# Patient Record
Sex: Male | Born: 1945 | Race: White | Hispanic: No | Smoking: Former smoker
Health system: Southern US, Community
[De-identification: ages and names within clinical notes are randomized; demographics above are authoritative.]

## PROBLEM LIST (undated history)

## (undated) DIAGNOSIS — J189 Pneumonia, unspecified organism: Secondary | ICD-10-CM

## (undated) DIAGNOSIS — Z860101 Personal history of adenomatous and serrated colon polyps: Secondary | ICD-10-CM

## (undated) DIAGNOSIS — G250 Essential tremor: Secondary | ICD-10-CM

## (undated) DIAGNOSIS — K449 Diaphragmatic hernia without obstruction or gangrene: Secondary | ICD-10-CM

## (undated) DIAGNOSIS — K439 Ventral hernia without obstruction or gangrene: Secondary | ICD-10-CM

## (undated) DIAGNOSIS — N401 Enlarged prostate with lower urinary tract symptoms: Secondary | ICD-10-CM

## (undated) DIAGNOSIS — I2510 Atherosclerotic heart disease of native coronary artery without angina pectoris: Secondary | ICD-10-CM

## (undated) DIAGNOSIS — L409 Psoriasis, unspecified: Secondary | ICD-10-CM

## (undated) DIAGNOSIS — E7800 Pure hypercholesterolemia, unspecified: Secondary | ICD-10-CM

## (undated) DIAGNOSIS — I10 Essential (primary) hypertension: Secondary | ICD-10-CM

## (undated) DIAGNOSIS — Z85820 Personal history of malignant melanoma of skin: Secondary | ICD-10-CM

## (undated) DIAGNOSIS — K5730 Diverticulosis of large intestine without perforation or abscess without bleeding: Secondary | ICD-10-CM

## (undated) DIAGNOSIS — Z859 Personal history of malignant neoplasm, unspecified: Secondary | ICD-10-CM

## (undated) DIAGNOSIS — Z86010 Personal history of colonic polyps: Secondary | ICD-10-CM

## (undated) DIAGNOSIS — Z9889 Other specified postprocedural states: Secondary | ICD-10-CM

## (undated) DIAGNOSIS — E782 Mixed hyperlipidemia: Secondary | ICD-10-CM

## (undated) DIAGNOSIS — I714 Abdominal aortic aneurysm, without rupture, unspecified: Secondary | ICD-10-CM

## (undated) DIAGNOSIS — R972 Elevated prostate specific antigen [PSA]: Secondary | ICD-10-CM

## (undated) HISTORY — PX: PROSTATE BIOPSY: SHX241

## (undated) HISTORY — PX: MOUTH SURGERY: SHX715

## (undated) HISTORY — PX: INGUINAL HERNIA REPAIR: SUR1180

## (undated) HISTORY — PX: APPENDECTOMY: SHX54

## (undated) HISTORY — PX: OTHER SURGICAL HISTORY: SHX169

## (undated) HISTORY — PX: UMBILICAL HERNIA REPAIR: SHX196

## (undated) HISTORY — DX: Essential (primary) hypertension: I10

## (undated) HISTORY — DX: Essential tremor: G25.0

## (undated) HISTORY — PX: LARYNGOSCOPY: SHX5203

## (undated) HISTORY — PX: COLONOSCOPY W/ POLYPECTOMY: SHX1380

## (undated) HISTORY — PX: ACHILLES TENDON REPAIR: SUR1153

## (undated) HISTORY — DX: Ventral hernia without obstruction or gangrene: K43.9

## (undated) HISTORY — DX: Pure hypercholesterolemia, unspecified: E78.00

---
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
## 1993-05-07 HISTORY — PX: ACHILLES TENDON REPAIR: SUR1153

## 1998-08-31 ENCOUNTER — Ambulatory Visit (HOSPITAL_COMMUNITY): Admission: RE | Admit: 1998-08-31 | Discharge: 1998-08-31 | Payer: Self-pay | Admitting: Gastroenterology

## 1998-08-31 HISTORY — PX: ESOPHAGOGASTRODUODENOSCOPY: SHX5428

## 2002-05-13 ENCOUNTER — Encounter: Payer: Self-pay | Admitting: General Surgery

## 2002-05-15 ENCOUNTER — Observation Stay (HOSPITAL_COMMUNITY): Admission: RE | Admit: 2002-05-15 | Discharge: 2002-05-16 | Payer: Self-pay | Admitting: General Surgery

## 2002-05-15 HISTORY — PX: LAPAROSCOPIC ASSISTED VENTRAL HERNIA REPAIR: SHX6312

## 2002-05-15 HISTORY — PX: VENTRAL HERNIA REPAIR: SHX424

## 2003-09-06 ENCOUNTER — Encounter (INDEPENDENT_AMBULATORY_CARE_PROVIDER_SITE_OTHER): Payer: Self-pay | Admitting: *Deleted

## 2003-09-06 ENCOUNTER — Ambulatory Visit (HOSPITAL_COMMUNITY): Admission: RE | Admit: 2003-09-06 | Discharge: 2003-09-06 | Payer: Self-pay | Admitting: Gastroenterology

## 2004-09-26 ENCOUNTER — Encounter: Payer: Self-pay | Admitting: Cardiology

## 2010-02-08 ENCOUNTER — Ambulatory Visit: Payer: Self-pay | Admitting: Cardiology

## 2010-02-10 ENCOUNTER — Ambulatory Visit: Payer: Self-pay | Admitting: Cardiology

## 2010-09-22 NOTE — Op Note (Signed)
Ruben Sosa, Ruben Sosa                             ACCOUNT NO.:  [id number]   MEDICAL RECORD NO.:  [id number]                   PATIENT TYPE:  AMB   LOCATION:  ENDO                                 FACILITY:  [facility]   PHYSICIAN:  Petra Kuba, M.D.                 DATE OF BIRTH:  November 20, 1945   DATE OF PROCEDURE:  09/06/2003  DATE OF DISCHARGE:                                 OPERATIVE REPORT   PROCEDURE:  Colonoscopy with polypectomy.   ANESTHESIA:  Demerol 75, Versed 8.   INDICATIONS FOR PROCEDURE:  Patient with history of colon polyps, here for  repeat screening.  Consent was signed after risks and benefits, methods,  options were thoroughly discussed in the office in the past.   DESCRIPTION OF PROCEDURE:  Rectal inspection was pertinent for external  hemorrhoids. Small digital exam was negative.  Video colonoscope was  inserted, easily advanced around the colon to the cecum.  This did require  some abdominal pressure but no position changes.  On insertion, left greater  than right diverticula were seen.  Also, a distal ascending small polyp was  seen on insertion and hot biopsied x1.  The cecum was identified by the  appendiceal orifice and the ileocecal valve.  Prep on the right side was  pertinent for some stool adherent to the wall, which could not be washed  off. The rest of the prep was adequate. There was some liquid stool that  required washing and suctioning throughout.  On slow withdrawal through the  colon, cecum and the remainder of the ascending were normal.  The polyp that was hot biopsied on insertion did not look like it had any  residual polypoid tissue. The scope was further withdrawn, other than the  left greater than right diverticula.  No additional findings were seen.  Instrument was slowly withdrawn back through the rectum. Anorectal pull-  through on retroflexion confirmed some small hemorrhoids. Scope was  reinserted towards the left side of the colon. Air was  suctioned, scope was  removed.  The patient tolerated the procedure well. There were no obvious immediate  complications.   ENDOSCOPIC DIAGNOSES:  1. Internal and external hemorrhoids.  2. Left greater than right diverticula.  3. Ascending small polyp, hot biopsied.  4. Otherwise within normal limits to the cecum.   PLAN:  Yearly rectals and guaiacs per Dr. Cato Mulligan.  Asked to see back p.r.n.,  otherwise await pathology and probably recheck on screening in 5 years.                                               Petra Kuba, M.D.   MEM/MEDQ  D:  09/06/2003  T:  09/06/2003  Job:  [id number]   cc:  Colleen Can. Deborah Chalk, M.D.  Fax: [phone]   Valetta Mole. Swords, M.D. [facility]

## 2010-09-22 NOTE — Op Note (Signed)
Ruben Sosa, Ruben Sosa                             ACCOUNT NO.:  [id number]   MEDICAL RECORD NO.:  [id number]                   PATIENT TYPE:  OBV   LOCATION:  0470                                 FACILITY:  [facility]   PHYSICIAN:  Angelia Mould. Derrell Lolling, M.D.             DATE OF BIRTH:  Sep 27, 1945   DATE OF PROCEDURE:  05/15/2002  DATE OF DISCHARGE:                                 OPERATIVE REPORT   PREOPERATIVE DIAGNOSIS:  Ventral hernia.   POSTOPERATIVE DIAGNOSIS:  Ventral hernia.   OPERATION PERFORMED:  Laparoscopic repair of ventral hernia with 15 x 15 cm  dual Gore-Tex mesh.   SURGEON:  Angelia Mould. Derrell Lolling, M.D.   OPERATIVE INDICATIONS:  This is a 65 year old white man, who had an  umbilical hernia repair 25 years ago.  He has had a recurrence for several  years that has been getting bigger, and he wants to have this repaired.  On  exam, he has an infraumbilical scar, and there is a moderately large  recurrent hernia above the umbilicus, perhaps 4 cm in diameter by physical  exam.  He is brought to the operating room electively.   OPERATIVE TECHNIQUE:  Following the induction of general endotracheal  anesthesia, a Foley catheter was inserted, and the abdomen was prepped and  draped in a sterile fashion.  Intravenous Kefzol was given.  Marcaine 0.5%  with epinephrine was used as a local infiltration anesthetic.  A 2 cm  transverse incision was made in the left side of the abdomen about the  anterior axillary line level.  Dissection was carried down through the  muscle layers in a muscle-splitting fashion.  The peritoneum was elevated  and the abdominal cavity entered under direct vision.  There were no  adhesions in this area, and this was an atraumatic entry.  A 10 mm Hasson  trocar was inserted and secured with a pursestring suture of 0 Vicryl.  Pneumoperitoneum was created.  Video camera was inserted.  We found that he  had about a 5 x 6 cm ventral hernia.  There were multiple  channels to this.  There was some omentum that was partially incarcerated in this.  I placed  two 5 mm trocars on the left side of the abdomen and two 5 mm trocars in the  right side of the abdomen.  The omentum was taken down with scissor cautery.  This was fairly straightforward with good visualization.  There was no  bleeding.  I marked the edges of the hernia defect with a spinal needle.  I  then measured 3-4 cm peripheral to that in all directions and marked what  turned out to be almost a perfect circle, 15 cm x 15 cm in diameter.  I  marked the skin in six equidistant points for fixation sutures.  A large  piece of dual Gore-Tex mesh was brought to the operative field and using  marking on the abdominal wall, was cut into an appropriate shape, and a  template was created using the marking pen, marking in six equidistant  points around the rim of this.  At these points, we placed fixation sutures  of #1 Novofil with the knots tied on the rough side of the mesh.  The smooth  side of the mesh was marked for reference.   We then rolled the mesh up and passed it through the Hasson trocar in the  left flank and into the abdominal cavity.  Under direct vision, we then  unrolled the mesh and positioned it.  At each of the six equidistant points  and slightly to peripheral to the edge of the Ruben Sosa, we made a small incision  and then passed the Endoclose device through the abdominal wall and through  the sutures up through this.  We made sure that we got at least a 1 cm bite  of fascia for security.  After all six fixation sutures were passed, we  elevated the mesh up to the abdominal wall, and it covered the defect quite  nicely without any redundancy but without any undue tension.  We then tied  all of the six fixation sutures and then elevated the skin to bury the  knots.  We used a 5 mm screw type tacking device to tack the mesh all the  way around the periphery.  We placed a tack at the  edge of the mesh  circumferentially, each tack being about 1 cm apart.  We then placed a few  tacks more centrally to affix the mesh to the anterior abdominal wall, but  great care was taken not to place any tack in the hernia defect where it  might injure the skin.  Photographs were taken.  We inspected the repair and  the omentum and the rest of the abdominal cavity.  There were no anatomic  abnormalities, and there was no bleeding.  The trocars were removed under  direct vision, and there was no bleeding from the trocar sites.  The  pneumoperitoneum was released.  The fascia at the left flank trocar site was  closed with 0 Vicryl sutures.  Skin incision in the left flank was closed  with skin staples.  The rest of the incisions were closed with Steri-Strips.  Clean bandages were placed and the patient taken to the recovery room in  stable condition.  Estimated blood loss was about 10-20 cc.  Complications  none.  Sponge, needle, and instrument counts were correct.                                               Angelia Mould. Derrell Lolling, M.D.    HMI/MEDQ  D:  05/15/2002  T:  05/15/2002  Job:  [id number]   cc:   Valetta Mole. Swords, M.D. [facility]  [address] [place]  [place] [address]  Fax: 1   Colleen Can. Deborah Chalk, M.D.  [address]  [place]  [place] [address]  Fax: [phone]

## 2011-05-23 ENCOUNTER — Telehealth: Payer: Self-pay | Admitting: *Deleted

## 2011-05-23 ENCOUNTER — Other Ambulatory Visit: Payer: Self-pay | Admitting: Nurse Practitioner

## 2011-05-23 ENCOUNTER — Other Ambulatory Visit: Payer: Self-pay | Admitting: *Deleted

## 2011-05-23 MED ORDER — BISOPROLOL-HYDROCHLOROTHIAZIDE 5-6.25 MG PO TABS: 1.0000 | ORAL_TABLET | Freq: Every day | ORAL | Status: DC

## 2011-05-23 MED ORDER — SIMVASTATIN 80 MG PO TABS: 80.0000 mg | ORAL_TABLET | Freq: Every day | ORAL | Status: DC

## 2011-05-23 MED ORDER — FENOFIBRATE MICRONIZED 134 MG PO CAPS: 134.0000 mg | ORAL_CAPSULE | Freq: Every day | ORAL | Status: DC

## 2011-05-23 NOTE — Telephone Encounter (Signed)
SPOKE WITH PT NEEDS REFILLS ON MEDS AND NEEDS  APPT WITH CARDIOLOGY FORMER TENNANT PT. DR ROSS TO SEE TRANSFERRED TO SCHEDULERS  TO SET UP APPT . REQUESTED MEDS   FILLED PER PT'S REQUEST /CY

## 2011-06-04 ENCOUNTER — Other Ambulatory Visit: Payer: Self-pay | Admitting: Dermatology

## 2011-06-11 ENCOUNTER — Ambulatory Visit (INDEPENDENT_AMBULATORY_CARE_PROVIDER_SITE_OTHER): Payer: BC Managed Care – PPO | Admitting: Internal Medicine

## 2011-06-11 ENCOUNTER — Encounter: Payer: Self-pay | Admitting: Internal Medicine

## 2011-06-11 DIAGNOSIS — N529 Male erectile dysfunction, unspecified: Secondary | ICD-10-CM

## 2011-06-11 DIAGNOSIS — I10 Essential (primary) hypertension: Secondary | ICD-10-CM

## 2011-06-11 DIAGNOSIS — E782 Mixed hyperlipidemia: Secondary | ICD-10-CM

## 2011-06-11 DIAGNOSIS — E785 Hyperlipidemia, unspecified: Secondary | ICD-10-CM

## 2011-06-11 NOTE — Progress Notes (Addendum)
HPI Patient is a 66 year old with a history of HTN, dyslipidemia, familial tremor.  He was previously followed by S. Deborah Chalk  Last seen October 2011. Since seen he has done well.  He is retired and is exercising more and watching what he eats.  He has lost signif wt. He denies CP . No SOB  No palpitations.  No Known Allergies  Current Outpatient Prescriptions  Medication Sig Dispense Refill  . Ascorbic Acid (VITAMIN C) 500 MG CAPS Take 500 mg by mouth 2 (two) times daily.      . ASPIRIN EC PO Take 81 mg by mouth daily.      . bisoprolol-hydrochlorothiazide (ZIAC) 5-6.25 MG per tablet Take 1 tablet by mouth daily.  90 tablet  3  . fenofibrate micronized (LOFIBRA) 134 MG capsule Take 1 capsule (134 mg total) by mouth daily before breakfast.  90 capsule  3  . fish oil-omega-3 fatty acids 1000 MG capsule Take 1 g by mouth 2 (two) times daily.      . Multiple Vitamin (MULTIVITAMIN) capsule Take 1 capsule by mouth daily.      . nebivolol (BYSTOLIC) 5 MG tablet Take 5 mg by mouth daily.      . NON FORMULARY Take 500 mg by mouth 2 (two) times daily. l-laine 500 mg      . simvastatin (ZOCOR) 80 MG tablet Take 1 tablet (80 mg total) by mouth at bedtime.  90 tablet  11    Past Medical History  Diagnosis Date  . Hypertension   . Ventral hernia   . Hypercholesteremia   . Familial tremor     Past Surgical History  Procedure Date  . Ventral hernia repair 05/15/2002  . Colonoscopy w/ polypectomy   . Esophagogastroduodenoscopy 08/31/1998    with biopsies  . Laryngoscopy     flexible fiberoptic  . Status post laparoscopic repair of a ventral incisional hernia 1/9/  . Appendectomy   . Vastectomy   . Achilles tendon repair   . Mouth surgery     Family History  Problem Relation Age of Onset  . Coronary artery disease Father     History   Social History  . Marital Status: Married    Spouse Name: N/A    Number of Children: N/A  . Years of Education: N/A   Occupational History  . Not on  file.   Social History Main Topics  . Smoking status: Not on file  . Smokeless tobacco: Not on file  . Alcohol Use:   . Drug Use:   . Sexually Active:    Other Topics Concern  . Not on file   Social History Narrative  . No narrative on file    Review of Systems:  All systems reviewed.  They are negative to the above problem except as previously stated.  Vital Signs: There were no vitals taken for this visit.  Physical Exam  HEENT:  Normocephalic, atraumatic. EOMI, PERRLA.  Neck: JVP is normal. No thyromegaly. No bruits.  Lungs: clear to auscultation. No rales no wheezes.  Heart: Regular rate and rhythm. Normal S1, S2. No S3.   No significant murmurs. PMI not displaced.  Abdomen:  Supple, nontender. Normal bowel sounds. No masses. No hepatomegaly.  Extremities:   Good distal pulses throughout. No lower extremity edema.  Musculoskeletal :moving all extremities.  Neuro:   alert and oriented x3.  CN II-XII grossly intact.  EKG:  Sinus bradycardia  54 bpm.   Assessment and Plan:

## 2011-06-12 ENCOUNTER — Ambulatory Visit: Payer: BC Managed Care – PPO

## 2011-06-12 ENCOUNTER — Encounter: Payer: Self-pay | Admitting: Internal Medicine

## 2011-06-12 DIAGNOSIS — I10 Essential (primary) hypertension: Secondary | ICD-10-CM

## 2011-06-12 DIAGNOSIS — E782 Mixed hyperlipidemia: Secondary | ICD-10-CM

## 2011-06-12 LAB — BASIC METABOLIC PANEL
CO2: 26 mEq/L (ref 19–32)
Calcium: 9.6 mg/dL (ref 8.4–10.5)
Chloride: 106 mEq/L (ref 96–112)
Creatinine, Ser: 0.7 mg/dL (ref 0.4–1.5)
Sodium: 140 mEq/L (ref 135–145)

## 2011-06-12 LAB — AST: AST: 25 U/L (ref 0–37)

## 2011-06-12 LAB — CBC WITH DIFFERENTIAL/PLATELET
Basophils Relative: 0.9 % (ref 0.0–3.0)
Eosinophils Absolute: 0.3 10*3/uL (ref 0.0–0.7)
Eosinophils Relative: 5.3 % — ABNORMAL HIGH (ref 0.0–5.0)
Hemoglobin: 13.6 g/dL (ref 13.0–17.0)
Lymphocytes Relative: 32 % (ref 12.0–46.0)
MCHC: 34 g/dL (ref 30.0–36.0)
MCV: 90.8 fl (ref 78.0–100.0)
Neutro Abs: 2.8 10*3/uL (ref 1.4–7.7)
Neutrophils Relative %: 54.2 % (ref 43.0–77.0)
RBC: 4.39 Mil/uL (ref 4.22–5.81)
WBC: 5.1 10*3/uL (ref 4.5–10.5)

## 2011-06-12 LAB — PSA: PSA: 1.05 ng/mL (ref 0.10–4.00)

## 2011-06-13 DIAGNOSIS — E785 Hyperlipidemia, unspecified: Secondary | ICD-10-CM | POA: Insufficient documentation

## 2011-06-13 DIAGNOSIS — I10 Essential (primary) hypertension: Secondary | ICD-10-CM | POA: Insufficient documentation

## 2011-06-13 DIAGNOSIS — N529 Male erectile dysfunction, unspecified: Secondary | ICD-10-CM | POA: Insufficient documentation

## 2011-06-13 NOTE — Assessment & Plan Note (Signed)
Discussed.  B blocker prob having some effect on this.  But helps his tremor as well as BP.   If he would like further evaluation I would recomm referral to urology.

## 2011-06-13 NOTE — Assessment & Plan Note (Signed)
Tolerating current dose of simvistatin.  Will check lipomed and compare to previous.

## 2011-06-13 NOTE — Assessment & Plan Note (Signed)
BP is good.  He would like to decrease meds if able.  He may notice some more improvement if he loses wt.  He can cut bisoprolol/HCTZ  In 1/2 and follow BP.   I encouraged him to stay active.

## 2011-06-14 LAB — NMR LIPOPROFILE WITH LIPIDS
HDL Particle Number: 36.1 umol/L (ref >=30.5)
LDL Size: 21 nm (ref >20.5)
Small LDL Particle Number: 533 nmol/L — ABNORMAL HIGH (ref <=527)
Triglycerides: 87 mg/dL (ref <150)

## 2011-08-16 ENCOUNTER — Other Ambulatory Visit: Payer: Self-pay | Admitting: Internal Medicine

## 2011-11-14 ENCOUNTER — Ambulatory Visit (INDEPENDENT_AMBULATORY_CARE_PROVIDER_SITE_OTHER): Payer: BC Managed Care – PPO | Admitting: *Deleted

## 2011-11-14 DIAGNOSIS — E785 Hyperlipidemia, unspecified: Secondary | ICD-10-CM

## 2011-11-14 DIAGNOSIS — R0989 Other specified symptoms and signs involving the circulatory and respiratory systems: Secondary | ICD-10-CM

## 2011-11-16 LAB — NMR LIPOPROFILE WITH LIPIDS
Cholesterol, Total: 196 mg/dL
HDL Particle Number: 40.1 umol/L
HDL-C: 56 mg/dL
LDL (calc): 127 mg/dL — ABNORMAL HIGH
LDL Particle Number: 1629 nmol/L — ABNORMAL HIGH
LDL Size: 21 nm
LP-IR Score: 43
Small LDL Particle Number: 888 nmol/L — ABNORMAL HIGH
Triglycerides: 63 mg/dL

## 2011-11-21 ENCOUNTER — Telehealth: Payer: Self-pay | Admitting: Internal Medicine

## 2011-11-21 NOTE — Telephone Encounter (Signed)
Reviewed chart and do not see where Dr Tenny Craw reviewed the labs. Will forward to Dr. Tenny Craw for review.  Patient aware not in the office until 7/29

## 2011-11-21 NOTE — Telephone Encounter (Signed)
New msg Pt had blood work last week and wants to know results

## 2011-11-22 NOTE — Telephone Encounter (Signed)
  Had been on 80 ZOcor and fenofibrate 134. Cut back to 40 Zocor and continue  fenofibrate Patient has had steroid injections recently Consider Vytorin 10/40--even alone. Review with pharmacy.

## 2011-11-23 NOTE — Telephone Encounter (Signed)
Vytorin 10/40 may be better option than fenfobrate and Zocor.  Addition of Zetia may help decrease particle numbers better than fenofibrate and pt's TG are well controlled.

## 2011-12-13 ENCOUNTER — Other Ambulatory Visit: Payer: Self-pay | Admitting: *Deleted

## 2011-12-13 DIAGNOSIS — E782 Mixed hyperlipidemia: Secondary | ICD-10-CM

## 2012-02-12 ENCOUNTER — Other Ambulatory Visit: Payer: BC Managed Care – PPO

## 2012-05-20 ENCOUNTER — Telehealth: Payer: Self-pay | Admitting: Internal Medicine

## 2012-05-20 DIAGNOSIS — E785 Hyperlipidemia, unspecified: Secondary | ICD-10-CM

## 2012-05-20 DIAGNOSIS — I10 Essential (primary) hypertension: Secondary | ICD-10-CM

## 2012-05-20 NOTE — Telephone Encounter (Signed)
Pt states he always gets a PSA done yearly.  He has been taking a lot of aleve for back issues and he is requesting that Dr Tenny Craw order a ua to check for protein.  He would like to get these labs done at the [facility] office along with his lipid and liver as soon as possible.  Is it ok to order the ua and the psa?

## 2012-05-20 NOTE — Telephone Encounter (Signed)
N/A.  LMTC. 

## 2012-05-20 NOTE — Telephone Encounter (Signed)
New problem :   Need lab work put into the system    Need dip stick test, PSA .

## 2012-05-20 NOTE — Telephone Encounter (Signed)
Follow-up:    Patient returned your call.  Please call back. 

## 2012-05-21 NOTE — Telephone Encounter (Signed)
Set up for lipomed panel, AST, BMET, TSH, PSA, CBC and UA at [facility].

## 2012-05-21 NOTE — Telephone Encounter (Signed)
Labs were ordered and pt was notified.

## 2012-05-21 NOTE — Telephone Encounter (Signed)
Recomm along with lipids to get CBC, BMET, TSH, PSA  And AST checked  Also check UA Set up for [facility] office.

## 2012-05-21 NOTE — Telephone Encounter (Signed)
He states he will get the labs done tomorrow.

## 2012-05-22 ENCOUNTER — Other Ambulatory Visit: Payer: Self-pay | Admitting: Internal Medicine

## 2012-05-23 ENCOUNTER — Other Ambulatory Visit (INDEPENDENT_AMBULATORY_CARE_PROVIDER_SITE_OTHER): Payer: BC Managed Care – PPO

## 2012-05-23 DIAGNOSIS — E785 Hyperlipidemia, unspecified: Secondary | ICD-10-CM

## 2012-05-23 DIAGNOSIS — I10 Essential (primary) hypertension: Secondary | ICD-10-CM

## 2012-05-23 LAB — BASIC METABOLIC PANEL
BUN: 16 mg/dL (ref 6–23)
CO2: 28 mEq/L (ref 19–32)
Calcium: 9.5 mg/dL (ref 8.4–10.5)
Chloride: 108 mEq/L (ref 96–112)
Creatinine, Ser: 0.7 mg/dL (ref 0.4–1.5)

## 2012-05-23 LAB — CBC WITH DIFFERENTIAL/PLATELET
Basophils Absolute: 0.1 10*3/uL (ref 0.0–0.1)
HCT: 41.5 % (ref 39.0–52.0)
Hemoglobin: 14.3 g/dL (ref 13.0–17.0)
Lymphs Abs: 1.8 10*3/uL (ref 0.7–4.0)
MCHC: 34.3 g/dL (ref 30.0–36.0)
MCV: 89.3 fl (ref 78.0–100.0)
Monocytes Absolute: 0.5 10*3/uL (ref 0.1–1.0)
Monocytes Relative: 7.9 % (ref 3.0–12.0)
Neutro Abs: 3.1 10*3/uL (ref 1.4–7.7)
RDW: 13.7 % (ref 11.5–14.6)

## 2012-05-23 LAB — URINALYSIS
Specific Gravity, Urine: 1.025 (ref 1.000–1.030)
Total Protein, Urine: NEGATIVE
Urine Glucose: NEGATIVE
Urobilinogen, UA: 0.2 (ref 0.0–1.0)
pH: 6 (ref 5.0–8.0)

## 2012-05-23 LAB — TSH: TSH: 1.62 u[IU]/mL (ref 0.35–5.50)

## 2012-05-27 LAB — NMR LIPOPROFILE WITH LIPIDS
Cholesterol, Total: 147 mg/dL (ref <200)
HDL Particle Number: 35.3 umol/L (ref >=30.5)
LDL Size: 20.5 nm — ABNORMAL LOW (ref >20.5)
LP-IR Score: 57 — ABNORMAL HIGH (ref <=45)
Large HDL-P: 3.6 umol/L — ABNORMAL LOW (ref >=4.8)
Large VLDL-P: 1.6 nmol/L (ref <=2.7)
Small LDL Particle Number: 460 nmol/L (ref <=527)

## 2012-08-27 ENCOUNTER — Other Ambulatory Visit: Payer: Self-pay | Admitting: *Deleted

## 2012-08-27 MED ORDER — BISOPROLOL-HYDROCHLOROTHIAZIDE 5-6.25 MG PO TABS: ORAL_TABLET | ORAL | Status: DC

## 2012-09-30 ENCOUNTER — Other Ambulatory Visit: Payer: Self-pay | Admitting: Dermatology

## 2012-11-25 ENCOUNTER — Other Ambulatory Visit: Payer: Self-pay | Admitting: Internal Medicine

## 2013-02-23 ENCOUNTER — Other Ambulatory Visit: Payer: Self-pay | Admitting: Internal Medicine

## 2013-05-05 ENCOUNTER — Telehealth: Payer: Self-pay | Admitting: Internal Medicine

## 2013-05-05 DIAGNOSIS — I10 Essential (primary) hypertension: Secondary | ICD-10-CM

## 2013-05-05 DIAGNOSIS — N529 Male erectile dysfunction, unspecified: Secondary | ICD-10-CM

## 2013-05-05 DIAGNOSIS — E785 Hyperlipidemia, unspecified: Secondary | ICD-10-CM

## 2013-05-05 NOTE — Telephone Encounter (Signed)
New message  Pt would like to know if dr Tenny Craw wants him to do his routine blood work

## 2013-05-05 NOTE — Telephone Encounter (Signed)
The pt was last seen by Dr Tenny Craw 06/11/11 and has a pending appointment with Dr Tenny Craw on 06/12/13.  I will forward this message to Dr Tenny Craw to review the pt's chart and give orders for what labs need to be checked on this pt prior to appointment.

## 2013-05-08 NOTE — Telephone Encounter (Signed)
Please set patient up for :  CBC, BMET, LipoMed panel, TSH, PSA and liver panel before clinic visit.

## 2013-05-11 NOTE — Telephone Encounter (Signed)
Spoke with pt, Aware of dr Tenny Craw recommendation orders placed

## 2013-05-27 ENCOUNTER — Other Ambulatory Visit: Payer: Self-pay | Admitting: Internal Medicine

## 2013-05-29 ENCOUNTER — Other Ambulatory Visit (INDEPENDENT_AMBULATORY_CARE_PROVIDER_SITE_OTHER): Payer: Managed Care, Other (non HMO)

## 2013-05-29 DIAGNOSIS — E785 Hyperlipidemia, unspecified: Secondary | ICD-10-CM

## 2013-05-29 DIAGNOSIS — N529 Male erectile dysfunction, unspecified: Secondary | ICD-10-CM

## 2013-05-29 DIAGNOSIS — I10 Essential (primary) hypertension: Secondary | ICD-10-CM

## 2013-05-29 LAB — BASIC METABOLIC PANEL
BUN: 14 mg/dL (ref 6–23)
CO2: 25 mEq/L (ref 19–32)
CREATININE: 0.6 mg/dL (ref 0.4–1.5)
Calcium: 9.4 mg/dL (ref 8.4–10.5)
Chloride: 106 mEq/L (ref 96–112)
GFR: 133.12 mL/min (ref >60.00)
Glucose, Bld: 101 mg/dL — ABNORMAL HIGH (ref 70–99)
POTASSIUM: 4.1 meq/L (ref 3.5–5.1)
Sodium: 138 mEq/L (ref 135–145)

## 2013-05-29 LAB — CBC WITH DIFFERENTIAL/PLATELET
BASOS ABS: 0 10*3/uL (ref 0.0–0.1)
Basophils Relative: 0.6 % (ref 0.0–3.0)
EOS ABS: 0.3 10*3/uL (ref 0.0–0.7)
Eosinophils Relative: 5.8 % — ABNORMAL HIGH (ref 0.0–5.0)
HCT: 41.1 % (ref 39.0–52.0)
Hemoglobin: 14 g/dL (ref 13.0–17.0)
Lymphocytes Relative: 31.4 % (ref 12.0–46.0)
Lymphs Abs: 1.6 10*3/uL (ref 0.7–4.0)
MCHC: 34.2 g/dL (ref 30.0–36.0)
MCV: 91.9 fl (ref 78.0–100.0)
Monocytes Absolute: 0.4 10*3/uL (ref 0.1–1.0)
Monocytes Relative: 7.5 % (ref 3.0–12.0)
NEUTROS ABS: 2.8 10*3/uL (ref 1.4–7.7)
NEUTROS PCT: 54.7 % (ref 43.0–77.0)
Platelets: 193 10*3/uL (ref 150.0–400.0)
RBC: 4.47 Mil/uL (ref 4.22–5.81)
RDW: 13.6 % (ref 11.5–14.6)
WBC: 5.2 10*3/uL (ref 4.5–10.5)

## 2013-05-29 LAB — HEPATIC FUNCTION PANEL
ALT: 32 U/L (ref 0–53)
AST: 25 U/L (ref 0–37)
Albumin: 4 g/dL (ref 3.5–5.2)
Alkaline Phosphatase: 60 U/L (ref 39–117)
Bilirubin, Direct: 0.1 mg/dL (ref 0.0–0.3)
TOTAL PROTEIN: 6.6 g/dL (ref 6.0–8.3)
Total Bilirubin: 1.1 mg/dL (ref 0.3–1.2)

## 2013-05-29 LAB — TSH: TSH: 1.78 u[IU]/mL (ref 0.35–5.50)

## 2013-05-29 LAB — PSA: PSA: 1.41 ng/mL (ref 0.10–4.00)

## 2013-06-01 LAB — NMR LIPOPROFILE WITH LIPIDS
CHOLESTEROL, TOTAL: 228 mg/dL — AB (ref <200)
HDL Particle Number: 32 umol/L (ref >=30.5)
HDL SIZE: 8.5 nm — AB (ref >=9.2)
HDL-C: 44 mg/dL (ref >=40)
LDL CALC: 140 mg/dL — AB (ref <100)
LDL PARTICLE NUMBER: 2067 nmol/L — AB (ref <1000)
LDL Size: 20.4 nm — ABNORMAL LOW (ref >20.5)
LP-IR Score: 73 — ABNORMAL HIGH (ref <=45)
Large HDL-P: 2.1 umol/L — ABNORMAL LOW (ref >=4.8)
Large VLDL-P: 3.9 nmol/L — ABNORMAL HIGH (ref <=2.7)
SMALL LDL PARTICLE NUMBER: 1290 nmol/L — AB (ref <=527)
Triglycerides: 220 mg/dL — ABNORMAL HIGH (ref <150)
VLDL SIZE: 44.5 nm (ref <=46.6)

## 2013-06-10 DIAGNOSIS — Z8669 Personal history of other diseases of the nervous system and sense organs: Secondary | ICD-10-CM | POA: Insufficient documentation

## 2013-06-12 ENCOUNTER — Encounter: Payer: Self-pay | Admitting: Internal Medicine

## 2013-06-12 ENCOUNTER — Ambulatory Visit (INDEPENDENT_AMBULATORY_CARE_PROVIDER_SITE_OTHER): Payer: Managed Care, Other (non HMO) | Admitting: Internal Medicine

## 2013-06-12 ENCOUNTER — Encounter (INDEPENDENT_AMBULATORY_CARE_PROVIDER_SITE_OTHER): Payer: Self-pay

## 2013-06-12 VITALS — BP 128/74 | HR 53 | Ht 74.0 in | Wt 212.0 lb

## 2013-06-12 DIAGNOSIS — I10 Essential (primary) hypertension: Secondary | ICD-10-CM

## 2013-06-12 NOTE — Progress Notes (Signed)
HPI Patient is a 68 year old with a history of HTN, dyslipidemia, familial tremor. He was previously followed by S. Deborah Chalk  I saw him in clinic in 2013 Since seen he has done well.  He is no longer having problems with back pain which hindered his activity in the past He lost wt then gained it back over the holidays  Is goal is to lose 20 more pounds. He does have some cramping in his legs  Both thighs and calves/feet.  Will move around  Take a K supplement from wife.  Drinks gatorade. Plays golf regularly. Denies CP  Breathing is good Has stopped all cholesterol meds.     No Known Allergies  Current Outpatient Prescriptions  Medication Sig Dispense Refill  . Ascorbic Acid (VITAMIN C) 500 MG CAPS Take 500 mg by mouth 2 (two) times daily.      . ASPIRIN EC PO Take 81 mg by mouth daily.      . bisoprolol-hydrochlorothiazide (ZIAC) 5-6.25 MG per tablet TAKE 1 TABLET BY MOUTH EVERY DAY  90 tablet  0  . fish oil-omega-3 fatty acids 1000 MG capsule Take 1 g by mouth 2 (two) times daily.      Marland Kitchen Lysine 500 MG CAPS Take by mouth daily.      . Multiple Vitamin (MULTIVITAMIN) capsule Take 1 capsule by mouth daily.      . sildenafil (REVATIO) 20 MG tablet as directed.       No current facility-administered medications for this visit.    Past Medical History  Diagnosis Date  . Hypertension   . Ventral hernia   . Hypercholesteremia   . Familial tremor     Past Surgical History  Procedure Laterality Date  . Ventral hernia repair  05/15/2002  . Colonoscopy w/ polypectomy    . Esophagogastroduodenoscopy  08/31/1998    with biopsies  . Laryngoscopy      flexible fiberoptic  . Status post laparoscopic repair of a ventral incisional hernia  1/9/  . Appendectomy    . Vastectomy    . Achilles tendon repair    . Mouth surgery      Family History  Problem Relation Age of Onset  . Coronary artery disease Father     History   Social History  . Marital Status: Married    Spouse Name: N/A     Number of Children: N/A  . Years of Education: N/A   Occupational History  . Not on file.   Social History Main Topics  . Smoking status: Current Some Day Smoker    Types: Cigars  . Smokeless tobacco: Not on file  . Alcohol Use: Not on file  . Drug Use: Not on file  . Sexual Activity: Not on file   Other Topics Concern  . Not on file   Social History Narrative  . No narrative on file    Review of Systems:  All systems reviewed.  They are negative to the above problem except as previously stated.  Vital Signs: Ht 6\' 2"  (1.88 m)  Wt 212 lb (96.163 kg)  BMI 27.21 kg/m2 BP 128/74  P 53  Physical Exam Patinet is in NAD HEENT:  Normocephalic, atraumatic. EOMI, PERRLA.  Neck: JVP is normal.  No bruits.  Lungs: clear to auscultation. No rales no wheezes.  Heart: Regular rate and rhythm. Normal S1, S2. No S3.   No significant murmurs. PMI not displaced.  Abdomen:  Supple, nontender. Normal bowel sounds. No masses. No  hepatomegaly.  Extremities:   Good distal pulses throughout. No lower extremity edema.  Musculoskeletal :moving all extremities.  Neuro:   alert and oriented x3.  CN II-XII grossly intact.  EKG  Sinus bradycardia  53 bpm.    Assessment and Plan:  1.  HTN  BP is good.  He notes rare dizziness.  With cramping in legs he suggested cutting Bystolic in 1/2  He can try this but he will need to keep close f/u on BP  If in upper 140s or 150s  or if diastolic upper 80s/90s he should go back to a whole tablet  2.  Hyperlipidemia.  There may have been a miscommunication when he stopped his meds.  He was on Tricor and Zocor.  After review with pharmacy I was considering Vytorin. He is off meds for now.  Admits to recent dietary indiscretions over holidays  Plans to lose wt Would recomm trial fo diet and wt loss  He will call when ready to get fasting labs again.    Otherwise f/u in 1 year

## 2013-07-22 ENCOUNTER — Other Ambulatory Visit: Payer: Self-pay | Admitting: Dermatology

## 2013-08-26 ENCOUNTER — Other Ambulatory Visit: Payer: Self-pay

## 2013-08-26 MED ORDER — BISOPROLOL-HYDROCHLOROTHIAZIDE 5-6.25 MG PO TABS: ORAL_TABLET | ORAL | Status: DC

## 2014-02-15 ENCOUNTER — Other Ambulatory Visit: Payer: Self-pay | Admitting: Gastroenterology

## 2014-03-08 ENCOUNTER — Other Ambulatory Visit: Payer: Self-pay | Admitting: Internal Medicine

## 2014-05-04 ENCOUNTER — Other Ambulatory Visit: Payer: Self-pay | Admitting: Internal Medicine

## 2014-11-11 ENCOUNTER — Ambulatory Visit (INDEPENDENT_AMBULATORY_CARE_PROVIDER_SITE_OTHER): Payer: Medicare HMO | Admitting: Internal Medicine

## 2014-11-11 ENCOUNTER — Encounter: Payer: Self-pay | Admitting: Internal Medicine

## 2014-11-11 VITALS — BP 138/82 | HR 66 | Ht 74.0 in | Wt 220.4 lb

## 2014-11-11 DIAGNOSIS — I10 Essential (primary) hypertension: Secondary | ICD-10-CM

## 2014-11-11 DIAGNOSIS — N529 Male erectile dysfunction, unspecified: Secondary | ICD-10-CM

## 2014-11-11 DIAGNOSIS — E785 Hyperlipidemia, unspecified: Secondary | ICD-10-CM

## 2014-11-11 NOTE — Progress Notes (Signed)
Cardiology Office Note   Date:  11/11/2014   ID:  Ruben Sosa, DOB [birth year], MRN [id number]  PCP:  Eartha Inch, MD  Cardiologist:   Dietrich Pates, MD    F/U of HTN and lipids    History of Present Illness: Ruben Sosa is a 69 y.o. male with a history of HTN, hyperlipidemai, tremor  Previously followed by Ruben Sosa him in Feb 2015  Since seen he has done well.  He remains active  Denies CP  No SOB  Still has some cramping in LE at night (bed)  Had Lifescreen done at work place.  Showed mild plaquing in R carotid  Full results not available  Currently has cold   Current Outpatient Prescriptions  Medication Sig Dispense Refill  . Ascorbic Acid (VITAMIN C) 500 MG CAPS Take 500 mg by mouth 2 (two) times daily.    . ASPIRIN EC PO Take 81 mg by mouth daily.    . bisoprolol-hydrochlorothiazide (ZIAC) 5-6.25 MG per tablet TAKE 1 TABLET EVERY DAY 90 tablet 2  . fish oil-omega-3 fatty acids 1000 MG capsule Take 1 g by mouth daily.     Marland Kitchen Lysine 500 MG CAPS Take by mouth daily.    . Multiple Vitamin (MULTIVITAMIN) capsule Take 2 capsules by mouth daily.     . naproxen sodium (ANAPROX) 220 MG tablet Take 220 mg by mouth 2 (two) times daily with a meal.    . OVER THE COUNTER MEDICATION Magnesium and potassium supplement---pt unsure of dosage    . sildenafil (REVATIO) 20 MG tablet as directed.     No current facility-administered medications for this visit.    Allergies:   Review of patient's allergies indicates no known allergies.   Past Medical History  Diagnosis Date  . Hypertension   . Ventral hernia   . Hypercholesteremia   . Familial tremor     Past Surgical History  Procedure Laterality Date  . Ventral hernia repair  05/15/2002  . Colonoscopy w/ polypectomy    . Esophagogastroduodenoscopy  08/31/1998    with biopsies  . Laryngoscopy      flexible fiberoptic  . Status post laparoscopic repair of a ventral incisional hernia  1/9/  . Appendectomy    . Vastectomy     . Achilles tendon repair    . Mouth surgery       Social History:  The patient  reports that he has been smoking Cigars.  He does not have any smokeless tobacco history on file.   Family History:  The patient's family history includes Coronary artery disease in his father.    ROS:  Please see the history of present illness. All other systems are reviewed and  Negative to the above problem except as noted.    PHYSICAL EXAM: VS:  BP 138/82 mmHg  Pulse 66  Ht  (1.88 m)  Wt 220 lb 6.4 oz (99.973 kg)  BMI 28.29 kg/m2  GEN: Well nourished, well developed, in no acute distress HEENT: normal Neck: no JVD, carotid bruits, or masses Cardiac: RRR; no murmurs, rubs, or gallops,no edema  Respiratory:  clear to auscultation bilaterally, normal work of breathing GI: soft, nontender, nondistended, + BS  No hepatomegaly  MS: no deformity Moving all extremities   Skin: warm and dry, no rash Neuro:  Strength and sensation are intact Psych: euthymic mood, full affect   EKG:  EKG is ordered today.  NSR 66 bpm    Lipid Panel  Component Value Date/Time   CHOL 228* 05/29/2013 0803   TRIG 220* 05/29/2013 0803   HDL 44 05/29/2013 0803   LDLCALC 140* 05/29/2013 0803      Wt Readings from Last 3 Encounters:  11/11/14 220 lb 6.4 oz (99.973 kg)  06/12/13 212 lb (96.163 kg)  06/11/11 214 lb (97.07 kg)      ASSESSMENT AND PLAN:  1.  HTN  Adequate control  Continue meds  2.  HL  Lipids have not been checked recently.  Would set up for lipmed. With plaquing seen at [facility] screen he should be on a statin.  Will review with him when I see  3.  HCM  Remains active  Trying to lose wt.  4.  Carotid plaquing  Pt will send information to get more records  Would like to document results of scan        made:   Labs/ tests ordered today include:  Orders Placed This Encounter  Procedures  . Basic Metabolic Panel (BMET)  . TSH  . NMR Lipoprofile with Lipids  . CBC  . PSA  . EKG  12-Lead     Disposition:   FU with me in 1 year    Signed, Dietrich Pates, MD  11/11/2014 6:26 PM    [facility] [address] [place], [place], [place]  [address] Phone: [phone]; Fax: [phone]

## 2014-11-11 NOTE — Patient Instructions (Signed)
Your physician recommends that you continue on your current medications as directed. Please refer to the Current Medication list given to you today. Your physician recommends that you return for lab work in: TOMORROW (CBC, BMET, TSH, PSA, NMR W/ LIPIDS)  Your physician wants you to follow-up in: 1 YEAR WITH DR ROSS.  You will receive a reminder letter in the mail two months in advance. If you don't receive a letter, please call our office to schedule the follow-up appointment.

## 2014-11-12 ENCOUNTER — Other Ambulatory Visit (INDEPENDENT_AMBULATORY_CARE_PROVIDER_SITE_OTHER): Payer: Medicare HMO | Admitting: *Deleted

## 2014-11-12 DIAGNOSIS — N529 Male erectile dysfunction, unspecified: Secondary | ICD-10-CM

## 2014-11-12 DIAGNOSIS — I10 Essential (primary) hypertension: Secondary | ICD-10-CM | POA: Diagnosis not present

## 2014-11-12 DIAGNOSIS — E785 Hyperlipidemia, unspecified: Secondary | ICD-10-CM

## 2014-11-12 LAB — BASIC METABOLIC PANEL
BUN: 14 mg/dL (ref 6–23)
CALCIUM: 9.7 mg/dL (ref 8.4–10.5)
CO2: 27 mEq/L (ref 19–32)
CREATININE: 0.63 mg/dL (ref 0.40–1.50)
Chloride: 102 mEq/L (ref 96–112)
GFR: 134.96 mL/min (ref >60.00)
GLUCOSE: 95 mg/dL (ref 70–99)
Potassium: 4.8 mEq/L (ref 3.5–5.1)
Sodium: 138 mEq/L (ref 135–145)

## 2014-11-12 LAB — CBC
HEMATOCRIT: 43.3 % (ref 39.0–52.0)
HEMOGLOBIN: 14.6 g/dL (ref 13.0–17.0)
MCHC: 33.6 g/dL (ref 30.0–36.0)
MCV: 90.1 fl (ref 78.0–100.0)
PLATELETS: 220 10*3/uL (ref 150.0–400.0)
RBC: 4.81 Mil/uL (ref 4.22–5.81)
RDW: 14 % (ref 11.5–15.5)
WBC: 8.4 10*3/uL (ref 4.0–10.5)

## 2014-11-12 LAB — TSH: TSH: 1.88 u[IU]/mL (ref 0.35–4.50)

## 2014-11-12 LAB — PSA: PSA: 1.2 ng/mL (ref 0.10–4.00)

## 2014-11-12 NOTE — Addendum Note (Signed)
Addended by: Tonita Phoenix on: 11/12/2014 08:23 AM   Modules accepted: Orders

## 2014-11-15 ENCOUNTER — Telehealth: Payer: Self-pay | Admitting: Nurse Practitioner

## 2014-11-15 LAB — NMR LIPOPROFILE WITH LIPIDS
CHOLESTEROL, TOTAL: 234 mg/dL — AB (ref 100–199)
HDL Particle Number: 32.2 umol/L (ref >=30.5)
HDL SIZE: 8.4 nm — AB (ref >=9.2)
HDL-C: 42 mg/dL (ref >39)
LARGE HDL: 1.4 umol/L — AB (ref >=4.8)
LDL CALC: 150 mg/dL — AB (ref 0–99)
LDL PARTICLE NUMBER: 1911 nmol/L — AB (ref <1000)
LDL SIZE: 20.3 nm (ref >=20.8)
LP-IR SCORE: 84 — AB (ref <=45)
Large VLDL-P: 9.3 nmol/L — ABNORMAL HIGH (ref <=2.7)
SMALL LDL PARTICLE NUMBER: 1020 nmol/L — AB (ref <=527)
TRIGLYCERIDES: 212 mg/dL — AB (ref 0–149)
VLDL Size: 52.6 nm — ABNORMAL HIGH (ref <=46.6)

## 2014-11-15 MED ORDER — ATORVASTATIN CALCIUM 20 MG PO TABS: 20.0000 mg | ORAL_TABLET | Freq: Every day | ORAL | Status: DC

## 2014-11-15 NOTE — Telephone Encounter (Signed)
Reviewed lab results with patient who verbalized understanding and states he will not start Atorvastatin 20 mg until he does further research. He states I can go ahead and send Rx to [facility].  He states he was started on a statin at age 69 by Dr. Deborah Chalk and was eventually taken off due to his good blood results; states he has taken Lipitor in the past.  He is aware to call back to schedule lab appointment 8 weeks after starting medication.

## 2014-11-15 NOTE — Telephone Encounter (Signed)
-----   Message from Pricilla Riffle, MD sent at 11/15/2014  6:42 AM EDT ----- CBC, electrolytes are normal.  Thyroid function is normal PSA is normal at 1.2. Lipid panel shows LDL is high at 150 with smaller LDL particles. With carotid scan he had showing mild plaquing he should be on a statin I would recomm atorvastatin 20 mg   F/U lipomed panel in 8 wks with AST.

## 2015-02-27 ENCOUNTER — Other Ambulatory Visit: Payer: Self-pay | Admitting: Internal Medicine

## 2015-08-25 ENCOUNTER — Telehealth: Payer: Self-pay | Admitting: Internal Medicine

## 2015-08-25 DIAGNOSIS — N529 Male erectile dysfunction, unspecified: Secondary | ICD-10-CM

## 2015-08-25 DIAGNOSIS — E785 Hyperlipidemia, unspecified: Secondary | ICD-10-CM

## 2015-08-25 DIAGNOSIS — I10 Essential (primary) hypertension: Secondary | ICD-10-CM

## 2015-08-25 NOTE — Telephone Encounter (Signed)
Pt called  He should come in for labs (CBC, CMET, PSA, TSH, lipid) Thurs prior to clinic visit

## 2015-08-25 NOTE — Telephone Encounter (Signed)
Ruben Sosa is calling because he is wanting labs done before his visit on 12/01/15 at 8am (no orders in the system).

## 2015-08-25 NOTE — Telephone Encounter (Signed)
Attempted to call the pt back to discuss requested labs prior to his 7/27 follow-up appt with Dr Tenny Craw, and no answer.  Will go ahead and forward this message to Dr Tenny Craw to review and advise on requested labs prior to his appt, if necessary.  Will follow-up with the pt accordingly.

## 2015-08-30 NOTE — Telephone Encounter (Signed)
Labs ordered for 11/24/15. Lab appointment scheduled.

## 2015-11-23 ENCOUNTER — Other Ambulatory Visit (INDEPENDENT_AMBULATORY_CARE_PROVIDER_SITE_OTHER): Payer: Medicare HMO | Admitting: *Deleted

## 2015-11-23 DIAGNOSIS — E785 Hyperlipidemia, unspecified: Secondary | ICD-10-CM

## 2015-11-23 DIAGNOSIS — I10 Essential (primary) hypertension: Secondary | ICD-10-CM | POA: Diagnosis not present

## 2015-11-23 DIAGNOSIS — N529 Male erectile dysfunction, unspecified: Secondary | ICD-10-CM

## 2015-11-23 LAB — CBC
HEMATOCRIT: 42.1 % (ref 38.5–50.0)
Hemoglobin: 14.3 g/dL (ref 13.2–17.1)
MCH: 30.8 pg (ref 27.0–33.0)
MCHC: 34 g/dL (ref 32.0–36.0)
MCV: 90.7 fL (ref 80.0–100.0)
MPV: 10.3 fL (ref 7.5–12.5)
Platelets: 193 10*3/uL (ref 140–400)
RBC: 4.64 MIL/uL (ref 4.20–5.80)
RDW: 13.6 % (ref 11.0–15.0)
WBC: 7.3 10*3/uL (ref 3.8–10.8)

## 2015-11-23 LAB — COMPREHENSIVE METABOLIC PANEL
ALT: 36 U/L (ref 9–46)
AST: 24 U/L (ref 10–35)
Albumin: 4.4 g/dL (ref 3.6–5.1)
Alkaline Phosphatase: 54 U/L (ref 40–115)
BUN: 12 mg/dL (ref 7–25)
CALCIUM: 9.4 mg/dL (ref 8.6–10.3)
CO2: 23 mmol/L (ref 20–31)
Chloride: 105 mmol/L (ref 98–110)
Creat: 0.65 mg/dL — ABNORMAL LOW (ref 0.70–1.25)
GLUCOSE: 113 mg/dL — AB (ref 65–99)
POTASSIUM: 4 mmol/L (ref 3.5–5.3)
Sodium: 139 mmol/L (ref 135–146)
Total Bilirubin: 0.9 mg/dL (ref 0.2–1.2)
Total Protein: 7 g/dL (ref 6.1–8.1)

## 2015-11-23 LAB — LIPID PANEL
CHOL/HDL RATIO: 3.3 ratio (ref <=5.0)
CHOLESTEROL: 143 mg/dL (ref 125–200)
HDL: 43 mg/dL (ref >=40)
LDL Cholesterol: 62 mg/dL (ref <130)
Triglycerides: 191 mg/dL — ABNORMAL HIGH (ref <150)
VLDL: 38 mg/dL — AB (ref <30)

## 2015-11-24 ENCOUNTER — Other Ambulatory Visit: Payer: Medicare HMO

## 2015-11-24 ENCOUNTER — Other Ambulatory Visit: Payer: Self-pay | Admitting: Internal Medicine

## 2015-11-24 LAB — TSH: TSH: 1.84 m[IU]/L (ref 0.40–4.50)

## 2015-11-24 LAB — PSA: PSA: 2.79 ng/mL (ref <=4.00)

## 2015-12-01 ENCOUNTER — Encounter: Payer: Self-pay | Admitting: Internal Medicine

## 2015-12-01 ENCOUNTER — Ambulatory Visit (INDEPENDENT_AMBULATORY_CARE_PROVIDER_SITE_OTHER): Payer: Medicare HMO | Admitting: Internal Medicine

## 2015-12-01 VITALS — BP 122/82 | HR 53 | Ht 74.0 in | Wt 228.6 lb

## 2015-12-01 DIAGNOSIS — E785 Hyperlipidemia, unspecified: Secondary | ICD-10-CM | POA: Diagnosis not present

## 2015-12-01 DIAGNOSIS — I10 Essential (primary) hypertension: Secondary | ICD-10-CM | POA: Diagnosis not present

## 2015-12-01 NOTE — Progress Notes (Signed)
Cardiology Office Note   Date:  12/01/2015   ID:  Ruben Sosa, DOB [birth year], MRN [id number]  PCP:  Eartha Inch, MD  Cardiologist:   Dietrich Pates, MD    F/U of HTN and HL     History of Present Illness: Ruben Sosa is a 70 y.o. male with a history of HTn, HL and tremor.  I saw him in July 2016   Lifescreen a year ago showed mild plaquing of R carotid   He has been on lipitor since   He denies CP  Breathing has been good  Remains active.   He has some cramping but later in day   Rest of day feels fine   Outpatient Medications Prior to Visit  Medication Sig Dispense Refill  . Ascorbic Acid (VITAMIN C) 500 MG CAPS Take 500 mg by mouth 2 (two) times daily.    . ASPIRIN EC PO Take 81 mg by mouth daily.    Marland Kitchen atorvastatin (LIPITOR) 20 MG tablet TAKE 1 TABLET BY MOUTH EVERY DAY 90 tablet 0  . bisoprolol-hydrochlorothiazide (ZIAC) 5-6.25 MG per tablet TAKE 1 TABLET EVERY DAY 90 tablet 2  . fish oil-omega-3 fatty acids 1000 MG capsule Take 1 g by mouth daily.     Marland Kitchen Lysine 500 MG CAPS Take by mouth daily.    . Multiple Vitamin (MULTIVITAMIN) capsule Take 2 capsules by mouth daily.     Marland Kitchen OVER THE COUNTER MEDICATION Magnesium and potassium supplement---pt unsure of dosage    . sildenafil (REVATIO) 20 MG tablet as directed.    . bisoprolol-hydrochlorothiazide (ZIAC) 5-6.25 MG tablet TAKE 1 TABLET BY MOUTH EVERY DAY (Patient not taking: Reported on 12/01/2015) 90 tablet 2  . naproxen sodium (ANAPROX) 220 MG tablet Take 220 mg by mouth 2 (two) times daily with a meal.     No facility-administered medications prior to visit.      Allergies:   Review of patient's allergies indicates no known allergies.   Past Medical History:  Diagnosis Date  . Familial tremor   . Hypercholesteremia   . Hypertension   . Ventral hernia     Past Surgical History:  Procedure Laterality Date  . ACHILLES TENDON REPAIR    . APPENDECTOMY    . COLONOSCOPY W/ POLYPECTOMY    .  ESOPHAGOGASTRODUODENOSCOPY  08/31/1998   with biopsies  . LARYNGOSCOPY     flexible fiberoptic  . MOUTH SURGERY    . status post laparoscopic repair of a ventral incisional hernia  1/9/  . vastectomy    . VENTRAL HERNIA REPAIR  05/15/2002     Social History:  The patient  reports that he has been smoking Cigars.  He has never used smokeless tobacco. He reports that he drinks alcohol. He reports that he does not use drugs.   Family History:  The patient's family history includes Coronary artery disease in his father.    ROS:  Please see the history of present illness. All other systems are reviewed and  Negative to the above problem except as noted.    PHYSICAL EXAM: VS:  BP 122/82   Pulse (!) 53   Ht  (1.88 m)   Wt 228 lb 9.6 oz (103.7 kg)   BMI 29.35 kg/m   GEN: Well nourished, well developed, in no acute distress  HEENT: normal  Neck: no JVD, carotid bruits, or masses Cardiac: RRR; no murmurs, rubs, or gallops,no edema  Respiratory:  clear to auscultation bilaterally, normal work of breathing  GI: soft, nontender, nondistended, + BS  No hepatomegaly  MS: no deformity Moving all extremities   Skin: warm and dry, no rash Neuro:  Strength and sensation are intact Psych: euthymic mood, full affect   EKG:  EKG is ordered today.  SB 53 bpm     Lipid Panel    Component Value Date/Time   CHOL 143 11/23/2015 0932   CHOL 234 (H) 11/12/2014 0823   TRIG 191 (H) 11/23/2015 0932   TRIG 212 (H) 11/12/2014 0823   HDL 43 11/23/2015 0932   HDL 42 11/12/2014 0823   CHOLHDL 3.3 11/23/2015 0932   VLDL 38 (H) 11/23/2015 0932   LDLCALC 62 11/23/2015 0932   LDLCALC 150 (H) 11/12/2014 0823      Wt Readings from Last 3 Encounters:  12/01/15 228 lb 9.6 oz (103.7 kg)  11/11/14 220 lb 6.4 oz (100 kg)  06/12/13 212 lb (96.2 kg)      ASSESSMENT AND PLAN:   1.  HTN,  BP is adequately controlled  Keep on same regimen    2  HL LDL is excellent  With his cramping (though  atypical) I would recomm a trial of Crestor 2.5 mg  This should be rel equivalent dosing   He will finish the lipitor that he has   3  CV dz  Continue ASA and statin  Labs good  Signed, Dietrich Pates, MD  12/01/2015 8:17 AM    [facility] [address] [place], [place], [place]  [address] Phone: [phone]; Fax: [phone]

## 2015-12-02 ENCOUNTER — Encounter: Payer: Self-pay | Admitting: Internal Medicine

## 2015-12-03 ENCOUNTER — Other Ambulatory Visit: Payer: Self-pay | Admitting: Internal Medicine

## 2015-12-15 NOTE — Addendum Note (Signed)
Addended by: Reesa Chew on: 12/15/2015 12:24 PM   Modules accepted: Orders

## 2015-12-29 ENCOUNTER — Other Ambulatory Visit: Payer: Self-pay | Admitting: Internal Medicine

## 2016-09-25 ENCOUNTER — Other Ambulatory Visit: Payer: Self-pay | Admitting: Internal Medicine

## 2016-12-19 ENCOUNTER — Encounter: Payer: Self-pay | Admitting: Internal Medicine

## 2016-12-22 ENCOUNTER — Other Ambulatory Visit: Payer: Self-pay | Admitting: Internal Medicine

## 2017-01-03 ENCOUNTER — Other Ambulatory Visit: Payer: Self-pay | Admitting: *Deleted

## 2017-01-03 ENCOUNTER — Other Ambulatory Visit: Payer: Medicare HMO | Admitting: *Deleted

## 2017-01-03 DIAGNOSIS — E785 Hyperlipidemia, unspecified: Secondary | ICD-10-CM

## 2017-01-03 DIAGNOSIS — N529 Male erectile dysfunction, unspecified: Secondary | ICD-10-CM

## 2017-01-03 DIAGNOSIS — I10 Essential (primary) hypertension: Secondary | ICD-10-CM

## 2017-01-04 LAB — CBC
Hematocrit: 42.4 % (ref 37.5–51.0)
Hemoglobin: 14.5 g/dL (ref 13.0–17.7)
MCH: 31.5 pg (ref 26.6–33.0)
MCHC: 34.2 g/dL (ref 31.5–35.7)
MCV: 92 fL (ref 79–97)
PLATELETS: 212 10*3/uL (ref 150–379)
RBC: 4.6 x10E6/uL (ref 4.14–5.80)
RDW: 14 % (ref 12.3–15.4)
WBC: 7.9 10*3/uL (ref 3.4–10.8)

## 2017-01-04 LAB — LIPID PANEL
Chol/HDL Ratio: 3.3 ratio (ref 0.0–5.0)
Cholesterol, Total: 154 mg/dL (ref 100–199)
HDL: 47 mg/dL (ref >39)
LDL Calculated: 79 mg/dL (ref 0–99)
Triglycerides: 140 mg/dL (ref 0–149)
VLDL Cholesterol Cal: 28 mg/dL (ref 5–40)

## 2017-01-04 LAB — TSH: TSH: 2.22 u[IU]/mL (ref 0.450–4.500)

## 2017-01-04 LAB — COMPREHENSIVE METABOLIC PANEL
ALK PHOS: 58 IU/L (ref 39–117)
ALT: 36 IU/L (ref 0–44)
AST: 23 IU/L (ref 0–40)
Albumin/Globulin Ratio: 1.8 (ref 1.2–2.2)
Albumin: 4.5 g/dL (ref 3.6–4.8)
BUN/Creatinine Ratio: 23 (ref 10–24)
BUN: 17 mg/dL (ref 8–27)
Bilirubin Total: 1 mg/dL (ref 0.0–1.2)
CO2: 21 mmol/L (ref 20–29)
CREATININE: 0.75 mg/dL — AB (ref 0.76–1.27)
Calcium: 9.2 mg/dL (ref 8.6–10.2)
Chloride: 103 mmol/L (ref 96–106)
GFR calc Af Amer: 108 mL/min/{1.73_m2} (ref >59)
GFR calc non Af Amer: 94 mL/min/{1.73_m2} (ref >59)
GLOBULIN, TOTAL: 2.5 g/dL (ref 1.5–4.5)
GLUCOSE: 101 mg/dL — AB (ref 65–99)
Potassium: 4.2 mmol/L (ref 3.5–5.2)
SODIUM: 141 mmol/L (ref 134–144)
Total Protein: 7 g/dL (ref 6.0–8.5)

## 2017-01-04 LAB — PSA: PROSTATE SPECIFIC AG, SERUM: 2.1 ng/mL (ref 0.0–4.0)

## 2017-01-09 NOTE — Progress Notes (Signed)
Cardiology Office Note   Date:  01/10/2017   ID:  Ruben Sosa, DOB 1946-02-21, MRN [id number]  PCP:  Ruben Inch, MD  Cardiologist:   Dietrich Pates, MD    F/U of HTN and HL     History of Present Illness: Ruben Sosa is a 71 y.o. male with a history of HTn, HL and tremor.  I saw him in July 2016   Lifescreen a couple years ago  showed mild plaquing of R carotid    Pt denies SOB  No dizziness  No CP   Active     Outpatient Medications Prior to Visit  Medication Sig Dispense Refill  . Ascorbic Acid (VITAMIN C) 500 MG CAPS Take 500 mg by mouth 2 (two) times daily.    . ASPIRIN EC PO Take 81 mg by mouth daily.    Marland Kitchen atorvastatin (LIPITOR) 20 MG tablet Take 1 tablet (20 mg total) by mouth daily. Please keep upcoming appointment for further refills 30 tablet 0  . bisoprolol-hydrochlorothiazide (ZIAC) 5-6.25 MG tablet TAKE 1 TABLET BY MOUTH EVERY DAY 90 tablet 3  . fish oil-omega-3 fatty acids 1000 MG capsule Take 1 g by mouth daily.     Marland Kitchen Lysine 500 MG CAPS Take by mouth daily.    . Multiple Vitamin (MULTIVITAMIN) capsule Take 2 capsules by mouth daily.     Marland Kitchen OVER THE COUNTER MEDICATION Magnesium and potassium supplement---pt unsure of dosage    . sildenafil (REVATIO) 20 MG tablet as directed.     No facility-administered medications prior to visit.      Allergies:   Patient has no known allergies.   Past Medical History:  Diagnosis Date  . Familial tremor   . Hypercholesteremia   . Hypertension   . Ventral hernia     Past Surgical History:  Procedure Laterality Date  . ACHILLES TENDON REPAIR    . APPENDECTOMY    . COLONOSCOPY W/ POLYPECTOMY    . ESOPHAGOGASTRODUODENOSCOPY  08/31/1998   with biopsies  . LARYNGOSCOPY     flexible fiberoptic  . MOUTH SURGERY    . status post laparoscopic repair of a ventral incisional hernia  1/9/  . vastectomy    . VENTRAL HERNIA REPAIR  05/15/2002     Social History:  The patient  reports that he has been smoking Cigars.  He  has never used smokeless tobacco. He reports that he drinks alcohol. He reports that he does not use drugs.   Family History:  The patient's family history includes Coronary artery disease in his father.    ROS:  Please see the history of present illness. All other systems are reviewed and  Negative to the above problem except as noted.    PHYSICAL EXAM: VS:  BP 126/76 (BP Location: Left Arm)   Pulse (!) 53   Ht 6\' 2"  (1.88 m)   Wt 223 lb 9.6 oz (101.4 kg)   BMI 28.71 kg/m   GEN: Well nourished, well developed, in no acute distress  HEENT: normal  Neck: no JVD, carotid bruits, or masses Cardiac: RRR; no murmurs, rubs, or gallops,no edema  Respiratory:  clear to auscultation bilaterally, normal work of breathing GI: soft, nontender, nondistended, + BS  No hepatomegaly  MS: no deformity Moving all extremities   Skin: warm and dry, no rash Neuro:  Strength and sensation are intact Psych: euthymic mood, full affect   EKG:  EKG is ordered today.  SB 53 bpm  Lipid Panel    Component Value Date/Time   CHOL 154 01/03/2017 0825   CHOL 234 (H) 11/12/2014 0823   TRIG 140 01/03/2017 0825   TRIG 212 (H) 11/12/2014 0823   HDL 47 01/03/2017 0825   HDL 42 11/12/2014 0823   CHOLHDL 3.3 01/03/2017 0825   CHOLHDL 3.3 11/23/2015 0932   VLDL 38 (H) 11/23/2015 0932   LDLCALC 79 01/03/2017 0825   LDLCALC 150 (H) 11/12/2014 0823      Wt Readings from Last 3 Encounters:  01/10/17 223 lb 9.6 oz (101.4 kg)  12/01/15 228 lb 9.6 oz (103.7 kg)  11/11/14 220 lb 6.4 oz (100 kg)      ASSESSMENT AND PLAN:   1.  HTN  BP is well controlled  Continue current regimen    2  HL Continue on lipitor at current dosing   3  CV dz  Pt will have f/u scan in 1 year (done outside, self pay)  4  Cramping  Occasionall  IN legs  Occasionally hands  I do not think related to lipitor  Rec  Hydration.  Can drink OJ For Potassium    Pt plans to increase activity  Lose weight  ENcouraged him to do  this  WIll f/u in 1 year     Signed, Dietrich Pates, MD  01/10/2017 9:57 AM    [facility] [address] [place], [place], [place]  [address] Phone: [phone]; Fax: [phone]

## 2017-01-10 ENCOUNTER — Ambulatory Visit (INDEPENDENT_AMBULATORY_CARE_PROVIDER_SITE_OTHER): Payer: Medicare HMO | Admitting: Internal Medicine

## 2017-01-10 VITALS — BP 126/76 | HR 53 | Ht 74.0 in | Wt 223.6 lb

## 2017-01-10 DIAGNOSIS — I10 Essential (primary) hypertension: Secondary | ICD-10-CM | POA: Diagnosis not present

## 2017-01-10 DIAGNOSIS — E782 Mixed hyperlipidemia: Secondary | ICD-10-CM | POA: Diagnosis not present

## 2017-01-10 NOTE — Patient Instructions (Signed)
Your physician recommends that you continue on your current medications as directed. Please refer to the Current Medication list given to you today.  Your physician wants you to follow-up in: 1 year with Dr. Tenny Craw.   You will receive a reminder letter in the mail two months in advance. If you don't receive a letter, please call our office to schedule the follow-up appointment.  Your physician recommends that you return for lab work in 1 year prior to your next appointment. (cbc, cmet, lipids, psa, tsh)

## 2017-01-20 ENCOUNTER — Other Ambulatory Visit: Payer: Self-pay | Admitting: Internal Medicine

## 2017-02-27 ENCOUNTER — Other Ambulatory Visit: Payer: Self-pay | Admitting: Internal Medicine

## 2017-12-16 ENCOUNTER — Telehealth: Payer: Self-pay | Admitting: Internal Medicine

## 2017-12-16 ENCOUNTER — Other Ambulatory Visit: Payer: Self-pay | Admitting: *Deleted

## 2017-12-16 DIAGNOSIS — N529 Male erectile dysfunction, unspecified: Secondary | ICD-10-CM

## 2017-12-16 DIAGNOSIS — E782 Mixed hyperlipidemia: Secondary | ICD-10-CM

## 2017-12-16 DIAGNOSIS — I10 Essential (primary) hypertension: Secondary | ICD-10-CM

## 2017-12-16 NOTE — Telephone Encounter (Signed)
Please put order in for CBC , CMET, TSH, PSA and lipid

## 2018-01-11 ENCOUNTER — Other Ambulatory Visit: Payer: Self-pay | Admitting: Internal Medicine

## 2018-01-14 ENCOUNTER — Other Ambulatory Visit: Payer: Medicare HMO

## 2018-01-14 DIAGNOSIS — E782 Mixed hyperlipidemia: Secondary | ICD-10-CM

## 2018-01-14 DIAGNOSIS — I10 Essential (primary) hypertension: Secondary | ICD-10-CM

## 2018-01-14 DIAGNOSIS — N529 Male erectile dysfunction, unspecified: Secondary | ICD-10-CM

## 2018-01-15 LAB — BASIC METABOLIC PANEL
BUN / CREAT RATIO: 14 (ref 10–24)
BUN: 10 mg/dL (ref 8–27)
CO2: 24 mmol/L (ref 20–29)
CREATININE: 0.71 mg/dL — AB (ref 0.76–1.27)
Calcium: 10 mg/dL (ref 8.6–10.2)
Chloride: 100 mmol/L (ref 96–106)
GFR calc Af Amer: 110 mL/min/{1.73_m2} (ref >59)
GFR, EST NON AFRICAN AMERICAN: 95 mL/min/{1.73_m2} (ref >59)
GLUCOSE: 92 mg/dL (ref 65–99)
Potassium: 4.9 mmol/L (ref 3.5–5.2)
SODIUM: 140 mmol/L (ref 134–144)

## 2018-01-15 LAB — CBC
HEMATOCRIT: 42.8 % (ref 37.5–51.0)
Hemoglobin: 14.2 g/dL (ref 13.0–17.7)
MCH: 30.9 pg (ref 26.6–33.0)
MCHC: 33.2 g/dL (ref 31.5–35.7)
MCV: 93 fL (ref 79–97)
PLATELETS: 212 10*3/uL (ref 150–450)
RBC: 4.59 x10E6/uL (ref 4.14–5.80)
RDW: 13.7 % (ref 12.3–15.4)
WBC: 6 10*3/uL (ref 3.4–10.8)

## 2018-01-15 LAB — PSA: PROSTATE SPECIFIC AG, SERUM: 1.8 ng/mL (ref 0.0–4.0)

## 2018-01-15 LAB — LIPID PANEL
CHOL/HDL RATIO: 3.1 ratio (ref 0.0–5.0)
Cholesterol, Total: 138 mg/dL (ref 100–199)
HDL: 44 mg/dL (ref >39)
LDL CALC: 66 mg/dL (ref 0–99)
Triglycerides: 139 mg/dL (ref 0–149)
VLDL Cholesterol Cal: 28 mg/dL (ref 5–40)

## 2018-01-15 LAB — HEPATIC FUNCTION PANEL
ALBUMIN: 4.7 g/dL (ref 3.5–4.8)
ALT: 38 IU/L (ref 0–44)
AST: 27 IU/L (ref 0–40)
Alkaline Phosphatase: 58 IU/L (ref 39–117)
BILIRUBIN TOTAL: 0.9 mg/dL (ref 0.0–1.2)
Bilirubin, Direct: 0.21 mg/dL (ref 0.00–0.40)
Total Protein: 6.8 g/dL (ref 6.0–8.5)

## 2018-01-15 LAB — TSH: TSH: 2.8 u[IU]/mL (ref 0.450–4.500)

## 2018-01-17 ENCOUNTER — Encounter: Payer: Self-pay | Admitting: Internal Medicine

## 2018-01-17 ENCOUNTER — Ambulatory Visit: Payer: Medicare HMO | Admitting: Internal Medicine

## 2018-01-17 VITALS — BP 134/88 | HR 51 | Ht 74.0 in | Wt 227.0 lb

## 2018-01-17 DIAGNOSIS — I10 Essential (primary) hypertension: Secondary | ICD-10-CM

## 2018-01-17 DIAGNOSIS — E785 Hyperlipidemia, unspecified: Secondary | ICD-10-CM

## 2018-01-17 NOTE — Patient Instructions (Signed)
Your physician recommends that you continue on your current medications as directed. Please refer to the Current Medication list given to you today.  Your physician wants you to follow-up in: 1 year with Dr. Tenny Craw.  You will receive a reminder letter in the mail two months in advance. If you don't receive a letter, please call our office to schedule the follow-up appointment.  Monitor BP at home. Call with any questions or concerns about readings.

## 2018-01-17 NOTE — Progress Notes (Signed)
Cardiology Office Note   Date:  01/17/2018   ID:  Ruben Sosa, DOB 08-18-1945, MRN [id number]  PCP:  Ruben Inch, MD  Cardiologist:   Ruben Pates, MD    F/U of HTN and HL     History of Present Illness: Ruben Sosa is a 72 y.o. male with a history of HTn, HL and tremor.  I saw him in clinic about 1 yeae ago  Since seen his breathing is OK   Denies CP   Working on diet   Wt is down    Outpatient Medications Prior to Visit  Medication Sig Dispense Refill  . Ascorbic Acid (VITAMIN C) 500 MG CAPS Take 500 mg by mouth 2 (two) times daily.    . ASPIRIN EC PO Take 81 mg by mouth daily.    Marland Kitchen atorvastatin (LIPITOR) 20 MG tablet TAKE 1 TABLET BY MOUTH EVERY DAY 90 tablet 0  . bisoprolol-hydrochlorothiazide (ZIAC) 5-6.25 MG tablet TAKE 1 TABLET BY MOUTH DAILY 90 tablet 3  . fish oil-omega-3 fatty acids 1000 MG capsule Take 1 g by mouth daily.     Marland Kitchen Lysine 500 MG CAPS Take by mouth daily.    . Multiple Vitamin (MULTIVITAMIN) capsule Take 2 capsules by mouth daily.     Marland Kitchen OVER THE COUNTER MEDICATION Magnesium and potassium supplement---pt unsure of dosage    . sildenafil (REVATIO) 20 MG tablet as directed.     No facility-administered medications prior to visit.      Allergies:   Patient has no known allergies.   Past Medical History:  Diagnosis Date  . Familial tremor   . Hypercholesteremia   . Hypertension   . Ventral hernia     Past Surgical History:  Procedure Laterality Date  . ACHILLES TENDON REPAIR    . APPENDECTOMY    . COLONOSCOPY W/ POLYPECTOMY    . ESOPHAGOGASTRODUODENOSCOPY  08/31/1998   with biopsies  . LARYNGOSCOPY     flexible fiberoptic  . MOUTH SURGERY    . status post laparoscopic repair of a ventral incisional hernia  1/9/  . vastectomy    . VENTRAL HERNIA REPAIR  05/15/2002     Social History:  The patient  reports that he has been smoking cigars. He has never used smokeless tobacco. He reports that he drinks alcohol. He reports that he does not  use drugs.   Family History:  The patient's family history includes Coronary artery disease in his father.    ROS:  Please see the history of present illness. All other systems are reviewed and  Negative to the above problem except as noted.    PHYSICAL EXAM: VS:  BP 134/88   Pulse (!) 51   Ht 6\' 2"  (1.88 m)   Wt 227 lb (103 kg)   BMI 29.15 kg/m   GEN: Overweight 72 yo , in no acute distress  HEENT: normal  Neck: no JVD, carotid bruits, or masses Cardiac: RRR; no murmurs, rubs, or gallops,no edema  Respiratory:  clear to auscultation bilaterally, normal work of breathing GI: soft, nontender, nondistended, + BS  No hepatomegaly  MS: no deformity Moving all extremities   Skin: warm and dry, no rash Neuro:  Strength and sensation are intact Psych: euthymic mood, full affect   EKG:  EKG is ordered today.  SB 51 bpm     Lipid Panel    Component Value Date/Time   CHOL 138 01/14/2018 1024   CHOL 234 (H) 11/12/2014 4098  TRIG 139 01/14/2018 1024   TRIG 212 (H) 11/12/2014 0823   HDL 44 01/14/2018 1024   HDL 42 11/12/2014 0823   CHOLHDL 3.1 01/14/2018 1024   CHOLHDL 3.3 11/23/2015 0932   VLDL 38 (H) 11/23/2015 0932   LDLCALC 66 01/14/2018 1024   LDLCALC 150 (H) 11/12/2014 0823      Wt Readings from Last 3 Encounters:  01/17/18 227 lb (103 kg)  01/10/17 223 lb 9.6 oz (101.4 kg)  12/01/15 228 lb 9.6 oz (103.7 kg)      ASSESSMENT AND PLAN:   1.  HTN  BP is well controlled  Keep on same meds   2  HL Excellent control of lipids     3  CV dz  Pt had screen   No signif change  4  LE cramping   At night   Occasional     Congrat on wt loss  WIll f/u in 1 year     Signed, Ruben Pates, MD  01/17/2018 9:58 AM    [facility] [address] [place], [place], [place]  [address] Phone: [phone]; Fax: [phone]

## 2018-01-20 ENCOUNTER — Other Ambulatory Visit: Payer: Self-pay | Admitting: *Deleted

## 2018-01-20 MED ORDER — ATORVASTATIN CALCIUM 20 MG PO TABS: 20.0000 mg | ORAL_TABLET | Freq: Every day | ORAL | 3 refills | Status: DC

## 2018-01-20 MED ORDER — BISOPROLOL-HYDROCHLOROTHIAZIDE 5-6.25 MG PO TABS: 1.0000 | ORAL_TABLET | Freq: Every day | ORAL | 3 refills | Status: DC

## 2018-01-20 NOTE — Addendum Note (Signed)
Addended by: Valrie Hart on: 01/20/2018 11:39 AM   Modules accepted: Orders

## 2018-05-07 HISTORY — PX: CATARACT EXTRACTION W/ INTRAOCULAR LENS IMPLANT: SHX1309

## 2018-09-16 ENCOUNTER — Telehealth: Payer: Self-pay

## 2018-09-16 DIAGNOSIS — E782 Mixed hyperlipidemia: Secondary | ICD-10-CM | POA: Insufficient documentation

## 2018-09-16 NOTE — Telephone Encounter (Signed)
YOUR CARDIOLOGY TEAM HAS ARRANGED FOR AN E-VISIT FOR YOUR APPOINTMENT - PLEASE REVIEW IMPORTANT INFORMATION BELOW SEVERAL DAYS PRIOR TO YOUR APPOINTMENT  Due to the recent COVID-19 pandemic, we are transitioning in-person office visits to tele-medicine visits in an effort to decrease unnecessary exposure to our patients, their families, and staff. These visits are billed to your insurance just like a normal visit is. We also encourage you to sign up for MyChart if you have not already done so. You will need a smartphone if possible. For patients that do not have this, we can still complete the visit using a regular telephone but do prefer a smartphone to enable video when possible. You may have a family member that lives with you that can help. If possible, we also ask that you have a blood pressure cuff and scale at home to measure your blood pressure, heart rate and weight prior to your scheduled appointment. Patients with clinical needs that need an in-person evaluation and testing will still be able to come to the office if absolutely necessary. If you have any questions, feel free to call our office.     YOUR PROVIDER WILL BE USING THE FOLLOWING PLATFORM TO COMPLETE YOUR VISIT: Doximity  . IF USING MYCHART - How to Download the MyChart App to Your SmartPhone   - If Apple, go to App Store and type in MyChart in the search bar and download the app. If Android, ask patient to go to Google Play Store and type in MyChart in the search bar and download the app. The app is free but as with any other app downloads, your phone may require you to verify saved payment information or Apple/Android password.  - You will need to then log into the app with your MyChart username and password, and select [facility] as your healthcare provider to link the account.  - When it is time for your visit, go to the MyChart app, find appointments, and click Begin Video Visit. Be sure to Select Allow for your device to  access the Microphone and Camera for your visit. You will then be connected, and your provider will be with you shortly.  **If you have any issues connecting or need assistance, please contact MyChart service desk [phone] ([phone])**  **If using a computer, in order to ensure the best quality for your visit, you will need to use either of the following Internet Browsers: Google Chrome or Microsoft Edge**  . IF USING DOXIMITY or [email or web address].ME - The staff will give you instructions on receiving your link to join the meeting the day of your visit.      2-3 DAYS BEFORE YOUR APPOINTMENT  You will receive a telephone call from one of our [facility] team members - your caller ID may say "Unknown caller." If this is a video visit, we will walk you through how to get the video launched on your phone. We will remind you check your blood pressure, heart rate and weight prior to your scheduled appointment. If you have an Apple Watch or Kardia, please upload any pertinent ECG strips the day before or morning of your appointment to MyChart. Our staff will also make sure you have reviewed the consent and agree to move forward with your scheduled tele-health visit.     THE DAY OF YOUR APPOINTMENT  Approximately 15 minutes prior to your scheduled appointment, you will receive a telephone call from one of [facility] team - your caller ID may say "Unknown caller."    Our staff will confirm medications, vital signs for the day and any symptoms you may be experiencing. Please have this information available prior to the time of visit start. It may also be helpful for you to have a pad of paper and pen handy for any instructions given during your visit. They will also walk you through joining the smartphone meeting if this is a video visit.    CONSENT FOR TELE-HEALTH VISIT - PLEASE REVIEW  I hereby voluntarily request, consent and authorize [facility] and its employed or contracted physicians, physician  assistants, nurse practitioners or other licensed health care professionals (the Practitioner), to provide me with telemedicine health care services (the "Services") as deemed necessary by the treating Practitioner. I acknowledge and consent to receive the Services by the Practitioner via telemedicine. I understand that the telemedicine visit will involve communicating with the Practitioner through live audiovisual communication technology and the disclosure of certain medical information by electronic transmission. I acknowledge that I have been given the opportunity to request an in-person assessment or other available alternative prior to the telemedicine visit and am voluntarily participating in the telemedicine visit.  I understand that I have the right to withhold or withdraw my consent to the use of telemedicine in the course of my care at any time, without affecting my right to future care or treatment, and that the Practitioner or I may terminate the telemedicine visit at any time. I understand that I have the right to inspect all information obtained and/or recorded in the course of the telemedicine visit and may receive copies of available information for a reasonable fee.  I understand that some of the potential risks of receiving the Services via telemedicine include:  . Delay or interruption in medical evaluation due to technological equipment failure or disruption; . Information transmitted may not be sufficient (e.g. poor resolution of images) to allow for appropriate medical decision making by the Practitioner; and/or  . In rare instances, security protocols could fail, causing a breach of personal health information.  Furthermore, I acknowledge that it is my responsibility to provide information about my medical history, conditions and care that is complete and accurate to the best of my ability. I acknowledge that Practitioner's advice, recommendations, and/or decision may be based on  factors not within their control, such as incomplete or inaccurate data provided by me or distortions of diagnostic images or specimens that may result from electronic transmissions. I understand that the practice of medicine is not an exact science and that Practitioner makes no warranties or guarantees regarding treatment outcomes. I acknowledge that I will receive a copy of this consent concurrently upon execution via email to the email address I last provided but may also request a printed copy by calling the office of [facility].    I understand that my insurance will be billed for this visit.   I have read or had this consent read to me. . I understand the contents of this consent, which adequately explains the benefits and risks of the Services being provided via telemedicine.  . I have been provided ample opportunity to ask questions regarding this consent and the Services and have had my questions answered to my satisfaction. . I give my informed consent for the services to be provided through the use of telemedicine in my medical care  By participating in this telemedicine visit I agree to the above.  

## 2018-09-17 ENCOUNTER — Encounter: Payer: Self-pay | Admitting: Cardiology

## 2018-09-17 ENCOUNTER — Telehealth: Payer: Self-pay | Admitting: Cardiology

## 2018-09-17 ENCOUNTER — Other Ambulatory Visit: Payer: Self-pay

## 2018-09-17 ENCOUNTER — Telehealth (INDEPENDENT_AMBULATORY_CARE_PROVIDER_SITE_OTHER): Payer: Medicare HMO | Admitting: Cardiology

## 2018-09-17 VITALS — BP 132/84 | HR 55 | Ht 74.0 in | Wt 201.0 lb

## 2018-09-17 DIAGNOSIS — I10 Essential (primary) hypertension: Secondary | ICD-10-CM

## 2018-09-17 DIAGNOSIS — E785 Hyperlipidemia, unspecified: Secondary | ICD-10-CM

## 2018-09-17 DIAGNOSIS — R079 Chest pain, unspecified: Secondary | ICD-10-CM | POA: Diagnosis not present

## 2018-09-17 NOTE — Progress Notes (Addendum)
Virtual Visit via Telephone Note   This visit type was conducted due to national recommendations for restrictions regarding the COVID-19 Pandemic (e.g. social distancing) in an effort to limit this patient's exposure and mitigate transmission in our community.  Due to his co-morbid illnesses, this patient is at least at moderate risk for complications without adequate follow up.  This format is felt to be most appropriate for this patient at this time.  The patient did not have access to video technology/had technical difficulties with video requiring transitioning to audio format only (telephone).  All issues noted in this document were discussed and addressed.  No physical exam could be performed with this format.  Please refer to the patient's chart for his  consent to telehealth for [facility].    Date:  09/17/2018   ID:  Ruben Sosa, DOB 11-30-1945, MRN [id number]  Patient Location: Home Provider Location: Home  PCP:  Eartha Inch, MD  Cardiologist:  Dietrich Pates, MD  Electrophysiologist:  None   Evaluation Performed:  Follow-Up Visit  Chief Complaint:  Chest tightness  History of Present Illness:    Ruben Sosa is a 73 y.o. male with hypertension, hyperlipidemia and familial tremor.    He went in for an office visit with his PCP for evaluation of chest tightness. Georgette Shell, PA called me to discuss her concerns. The patient lost his wife to metastatic gallbladder cancer in 05/2018 that was sudden and rapid. He has been grieving since then. He has 3 daughters including 1 that is a PA In [place] [facility] in [place]. They are all grieving and concerned about their father.    [name] says that his chest tightness has been going on for several weeks, "like a tickle". Started about early March.  He has allergies and feels that this is a symptom of his allergies. He has been doing yardwork and blowing leaves. He also notes a deepening of his voice and occ runny nose that he often gets  during allergy season.  His chest tightness is not brought on by activity, although occ worse when he feels choked up about his wife. He notes having lost 30 pounds since his wife became ill, but states that his wt is now stable at around 200 lbs.   He walks with his daughter and grandchild for 45 minutes several times per week with no shortness of breath or chest pressure. He walks on treadmill or uses elliptical at home 2-3 times per weeks. He feels better after the walk and fresh air than when sitting in the house. He does not feel that he is slowed down due to his symptoms. He has no orthopnea, PND or edema.   He feels like he has a good understanding of his body and does not feel that this is a heart related issue. He understands that his daughters are concerned and wants to do what is best to calm their fears.   EKG was done at the PCP office yesterday. 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 She did not have an old EKG for comparison. I have asked her to have the image faxed to our office. Other labs that she checked were good including an excellent lipid profile.   The patient does not have symptoms concerning for COVID-19 infection (fever, chills, cough, or new shortness of breath).    Past Medical History:  Diagnosis Date  .  Familial tremor   . Hypercholesteremia   . Hypertension   . Ventral hernia    Past Surgical History:  Procedure Laterality Date  . ACHILLES TENDON REPAIR    . APPENDECTOMY    . COLONOSCOPY W/ POLYPECTOMY    . ESOPHAGOGASTRODUODENOSCOPY  08/31/1998   with biopsies  . LARYNGOSCOPY     flexible fiberoptic  . MOUTH SURGERY    . status post laparoscopic repair of a ventral incisional hernia  1/9/  . vastectomy    . VENTRAL HERNIA REPAIR  05/15/2002     Current Meds  Medication Sig  . Ascorbic Acid (VITAMIN C) 500 MG CAPS Take 500 mg by mouth daily.   . ASPIRIN EC  PO Take 81 mg by mouth daily.  Marland Kitchen. atorvastatin (LIPITOR) 20 MG tablet Take 1 tablet (20 mg total) by mouth daily.  . bisoprolol-hydrochlorothiazide (ZIAC) 5-6.25 MG tablet Take 1 tablet by mouth daily.  . Coenzyme Q10 (CO Q 10) 100 MG CAPS Take 100 mg by mouth daily.  . fish oil-omega-3 fatty acids 1000 MG capsule Take 1 g by mouth daily.   Marland Kitchen. Lysine 500 MG CAPS Take by mouth daily.  . Magnesium 250 MG TABS Take 250 mg by mouth daily.  . Multiple Vitamin (MULTIVITAMIN) capsule Take 2 capsules by mouth daily.   . Potassium 99 MG TABS Take 1 tablet by mouth daily.  . sildenafil (REVATIO) 20 MG tablet as directed.  . triamcinolone cream (KENALOG) 0.1 % Use as directed as needed  . vitamin E 400 UNIT capsule Take 400 Units by mouth daily.     Allergies:   Patient has no known allergies.   Social History   Tobacco Use  . Smoking status: Current Some Day Smoker    Types: Cigars  . Smokeless tobacco: Never Used  Substance Use Topics  . Alcohol use: Yes  . Drug use: No     Family Hx: The patient's family history includes Coronary artery disease in his father.  ROS:   Please see the history of present illness.     All other systems reviewed and are negative.   Prior CV studies:   The following studies were reviewed today:  None recent  Labs/Other Tests and Data Reviewed:    EKG:  No ECG reviewed. Requested from PCP-- EKG from 09/16/2018 shows sinus rhythm with possible new change in V2, with loss of r wave.   Recent Labs: 01/14/2018: ALT 38; BUN 10; Creatinine, Ser 0.71; Hemoglobin 14.2; Platelets 212; Potassium 4.9; Sodium 140; TSH 2.800   Recent Lipid Panel Lab Results  Component Value Date/Time   CHOL 138 01/14/2018 10:24 AM   CHOL 234 (H) 11/12/2014 08:23 AM   TRIG 139 01/14/2018 10:24 AM   TRIG 212 (H) 11/12/2014 08:23 AM   HDL 44 01/14/2018 10:24 AM   HDL 42 11/12/2014 08:23 AM   CHOLHDL 3.1 01/14/2018 10:24 AM   CHOLHDL 3.3 11/23/2015 09:32 AM   LDLCALC 66  01/14/2018 10:24 AM   LDLCALC 150 (H) 11/12/2014 08:23 AM    Wt Readings from Last 3 Encounters:  09/17/18 201 lb (91.2 kg)  01/17/18 227 lb (103 kg)  01/10/17 223 lb 9.6 oz (101.4 kg)     Objective:    Vital Signs:  BP 132/84 Comment: pt doesnt have access to bp cuff  Pulse (!) 55   Ht 6\' 2"  (1.88 m)   Wt 201 lb (91.2 kg)   BMI 25.81 kg/m    VITAL SIGNS:  reviewed RESPIRATORY:  Breathing sound normal, pt able to speak in complete sentences NEURO:  alert and oriented x 3, no obvious focal deficit PSYCH:  normal affect, Pt expresses some sadness and gets "choked up" when discussing his wife's passing.   ASSESSMENT & PLAN:    1. Chest pain -Pt with new onset of mild chest tightness over the last several weeks. Not related to activity. No activity intolerance or DOE. Is able to walk for 2 miles. -Pt with CVD risk factors of HTN (well controlled), Hyperlipidemia (well controlled), obesity (recently lost 30 pounds), and stress/grief.  -EKG done at PCP office yesterday shows possible slight change in V2, with loss of r wave.  -I am not highly suspicious of high grade myocardial ischemia, but he could have some underlying CAD aggravated by his recent increased stress.  -No recent cardiac testing.  -Will check lexiscan myoview. (Pt would be able to walk on treadmill but we are limiting aerosolizing procedures during COVID 19 pandemic). If results indicate high risk findings, he would need a cardiac cath.   2. Hypertension -On bisoprolol-HCTZ -BP well controlled.   3. Hyperlipidemia -On atorvastatin 20 mg and Omega-3 fatty acids -LDL 59, very well controlled  4. Obesity -Body mass index is 25.81 kg/m. Recent wt loss of ~30 pounds due to grieving for his wife.   Mr. Gillitzer would like for me to share this visit information with Dr. Tenny Craw for her input as he has a long time relationship with her.  He would also like for me to fax the EKG to his daughter, Kaptain Cruise, [place] at  [email or web address]   COVID-19 Education: The signs and symptoms of COVID-19 were discussed with the patient and how to seek care for testing (follow up with PCP or arrange E-visit).  The importance of social distancing was discussed today.  Time:   Today, I have spent 33 minutes with the patient with telehealth technology discussing the above problems.     Medication Adjustments/Labs and Tests Ordered: Current medicines are reviewed at length with the patient today.  Concerns regarding medicines are outlined above.   Tests Ordered: No orders of the defined types were placed in this encounter.   Medication Changes: No orders of the defined types were placed in this encounter.   Disposition:  Follow up in 3 month(s)  Signed, [facility], NP  09/17/2018 8:38 AM     [facility]

## 2018-09-17 NOTE — Telephone Encounter (Signed)
The PA that referred the patient would like to speak with Berton Bon before the patients visit if possible

## 2018-09-17 NOTE — Telephone Encounter (Signed)
New message:   Patient returning call back stating some called him. Did not see a note.

## 2018-09-17 NOTE — Patient Instructions (Signed)
Medication Instructions:  Your physician recommends that you continue on your current medications as directed. Please refer to the Current Medication list given to you today.  If you need a refill on your cardiac medications before your next appointment, please call your pharmacy.   Lab work: None  If you have labs (blood work) drawn today and your tests are completely normal, you will receive your results only by: Marland Kitchen MyChart Message (if you have MyChart) OR . A paper copy in the mail If you have any lab test that is abnormal or we need to change your treatment, we will call you to review the results.  Testing/Procedures: Your physician has requested that you have a lexiscan myoview. For further information please visit [email or web address] Please follow instruction sheet, as given.   Follow-Up: Follow up with Dr. Tenny Craw in 3 months   Any Other Special Instructions Will Be Listed Below (If Applicable).

## 2018-09-18 ENCOUNTER — Telehealth: Payer: Self-pay

## 2018-09-18 ENCOUNTER — Telehealth: Payer: Self-pay | Admitting: Internal Medicine

## 2018-09-18 NOTE — Telephone Encounter (Signed)
I spoke to patient    REviewed clinic visit he had yesterday with Lonell Face.   Discussed EKG from S Carter's office  FOr now I would continue to observe   Keep on same meds    No exertional component except for when in yard (? Pulmonary, allergies)  WIll contact pt next week to review  Cancel myovue that is scheduled for next week

## 2018-09-18 NOTE — Telephone Encounter (Signed)
I left the patient a message informing him of the cancellation of his stress test for 5/20 at [facility] office per Dr Tenny Craw.  I told him to call, if questions.

## 2018-09-24 ENCOUNTER — Inpatient Hospital Stay (HOSPITAL_COMMUNITY): Admission: RE | Admit: 2018-09-24 | Payer: Medicare HMO | Source: Ambulatory Visit

## 2018-09-26 ENCOUNTER — Telehealth: Payer: Self-pay | Admitting: Internal Medicine

## 2018-09-26 DIAGNOSIS — R059 Cough, unspecified: Secondary | ICD-10-CM

## 2018-09-26 DIAGNOSIS — R05 Cough: Secondary | ICD-10-CM

## 2018-09-26 DIAGNOSIS — R0789 Other chest pain: Secondary | ICD-10-CM

## 2018-09-26 DIAGNOSIS — Z8279 Family history of other congenital malformations, deformations and chromosomal abnormalities: Secondary | ICD-10-CM

## 2018-09-26 NOTE — Telephone Encounter (Signed)
Spoke to patient  Occasional cough, feels full in chest   Scant clear sputum Will set up for ultrasound to evaluate LV/RV systolic /diastolic function FHX of marfans (Pt's CXR normal)  Further testing based on that

## 2018-09-26 NOTE — Addendum Note (Signed)
Addended by: Lendon Ka on: 09/26/2018 02:54 PM   Modules accepted: Orders

## 2018-09-26 NOTE — Telephone Encounter (Signed)
Spoke with patient and scheduled/ordered echo.  Pt prefers early am appointments.

## 2018-10-06 ENCOUNTER — Telehealth (HOSPITAL_COMMUNITY): Payer: Self-pay

## 2018-10-06 NOTE — Telephone Encounter (Signed)

## 2018-10-07 ENCOUNTER — Other Ambulatory Visit: Payer: Self-pay

## 2018-10-07 ENCOUNTER — Ambulatory Visit (HOSPITAL_COMMUNITY): Payer: Medicare HMO | Attending: Cardiology

## 2018-10-07 DIAGNOSIS — Z8279 Family history of other congenital malformations, deformations and chromosomal abnormalities: Secondary | ICD-10-CM

## 2018-10-07 DIAGNOSIS — R05 Cough: Secondary | ICD-10-CM | POA: Insufficient documentation

## 2018-10-07 DIAGNOSIS — R059 Cough, unspecified: Secondary | ICD-10-CM

## 2018-10-07 DIAGNOSIS — R0789 Other chest pain: Secondary | ICD-10-CM | POA: Diagnosis not present

## 2018-10-10 ENCOUNTER — Telehealth: Payer: Self-pay

## 2018-10-10 ENCOUNTER — Other Ambulatory Visit: Payer: Self-pay

## 2018-10-10 DIAGNOSIS — R05 Cough: Secondary | ICD-10-CM

## 2018-10-10 DIAGNOSIS — Z8279 Family history of other congenital malformations, deformations and chromosomal abnormalities: Secondary | ICD-10-CM

## 2018-10-10 DIAGNOSIS — R059 Cough, unspecified: Secondary | ICD-10-CM

## 2018-10-10 DIAGNOSIS — R0789 Other chest pain: Secondary | ICD-10-CM

## 2018-10-10 DIAGNOSIS — R062 Wheezing: Secondary | ICD-10-CM

## 2018-10-10 MED ORDER — FLUTICASONE PROPIONATE HFA 220 MCG/ACT IN AERO: 2.0000 | INHALATION_SPRAY | Freq: Two times a day (BID) | RESPIRATORY_TRACT | 3 refills | Status: DC

## 2018-10-10 NOTE — Telephone Encounter (Signed)
-----   Message from Sigurd Sos, RN sent at 10/09/2018  5:00 PM EDT -----  ----- Message ----- From: Pricilla Riffle, MD Sent: 10/09/2018   4:19 PM EDT To: Pricilla Riffle, MD, [facility] Triage  Called patient re echo WIll need to get a scan of aorta   WIll call to schedule He still has some wheezing  ? Reactive airway Would recomm flovent or generic equivalent 220 mcg   1 puff bid Call in to [facility]

## 2018-10-10 NOTE — Progress Notes (Signed)
ERROR

## 2018-10-10 NOTE — Telephone Encounter (Signed)
Pt agreed to trying Flovent and will order the Aortic Scan.. pt wishes to be called on his cell [phone].

## 2018-10-10 NOTE — Telephone Encounter (Signed)
Pt needs CT Angiogram at [facility] w/ FFR... need to cancel previous CT order. Will order new test per Dr. Tenny Craw and send to [facility] to cancel previous order.

## 2018-10-15 NOTE — Telephone Encounter (Signed)
Cancelled CT aorta.  Awaiting cardiac CT scheduling.

## 2018-10-24 ENCOUNTER — Telehealth: Payer: Self-pay | Admitting: *Deleted

## 2018-10-24 ENCOUNTER — Encounter: Payer: Self-pay | Admitting: *Deleted

## 2018-10-24 DIAGNOSIS — I10 Essential (primary) hypertension: Secondary | ICD-10-CM

## 2018-10-24 DIAGNOSIS — Z01812 Encounter for preprocedural laboratory examination: Secondary | ICD-10-CM

## 2018-10-24 NOTE — Telephone Encounter (Signed)
Left message for patient to call back. BMET is needed prior to cardiac ct which is scheduled for 10/30/18.  Asked pt to call back and schedule when he will come in for lab work. Instruction letter for CT is completed and will be placed at check in for him to pick up when he arrives for lab.

## 2018-10-24 NOTE — Telephone Encounter (Signed)
New Message    Patient will be coming in for Labs on Monday 10/27/18 and will like to pick up instruction letter that day as well.

## 2018-10-27 ENCOUNTER — Other Ambulatory Visit: Payer: Self-pay

## 2018-10-27 ENCOUNTER — Other Ambulatory Visit: Payer: Medicare HMO | Admitting: *Deleted

## 2018-10-27 DIAGNOSIS — Z01812 Encounter for preprocedural laboratory examination: Secondary | ICD-10-CM

## 2018-10-27 DIAGNOSIS — I10 Essential (primary) hypertension: Secondary | ICD-10-CM

## 2018-10-27 LAB — BASIC METABOLIC PANEL
BUN/Creatinine Ratio: 19 (ref 10–24)
BUN: 13 mg/dL (ref 8–27)
CO2: 22 mmol/L (ref 20–29)
Calcium: 9.8 mg/dL (ref 8.6–10.2)
Chloride: 101 mmol/L (ref 96–106)
Creatinine, Ser: 0.69 mg/dL — ABNORMAL LOW (ref 0.76–1.27)
GFR calc Af Amer: 110 mL/min/{1.73_m2} (ref >59)
GFR calc non Af Amer: 96 mL/min/{1.73_m2} (ref >59)
Glucose: 97 mg/dL (ref 65–99)
Potassium: 4.3 mmol/L (ref 3.5–5.2)
Sodium: 137 mmol/L (ref 134–144)

## 2018-10-29 ENCOUNTER — Telehealth (HOSPITAL_COMMUNITY): Payer: Self-pay | Admitting: Emergency Medicine

## 2018-10-29 NOTE — Telephone Encounter (Signed)
Reaching out to patient to offer assistance regarding upcoming cardiac imaging study; pt verbalizes understanding of appt date/time, parking situation and where to check in, pre-test NPO status and medications ordered, and verified current allergies; name and call back number provided for further questions should they arise Marlayna Bannister RN Navigator Cardiac Imaging [facility] [phone] office [phone] cell  Pt denies covid symptoms, verbalized understanding of visitor policy. 

## 2018-10-30 ENCOUNTER — Ambulatory Visit (HOSPITAL_COMMUNITY)
Admission: RE | Admit: 2018-10-30 | Discharge: 2018-10-30 | Disposition: A | Discharge status: Home / Self Care | Payer: Medicare HMO | Source: Ambulatory Visit | Attending: Internal Medicine | Admitting: Internal Medicine

## 2018-10-30 ENCOUNTER — Other Ambulatory Visit: Payer: Self-pay

## 2018-10-30 DIAGNOSIS — I2510 Atherosclerotic heart disease of native coronary artery without angina pectoris: Secondary | ICD-10-CM | POA: Diagnosis not present

## 2018-10-30 DIAGNOSIS — R0789 Other chest pain: Secondary | ICD-10-CM | POA: Diagnosis not present

## 2018-10-30 DIAGNOSIS — Z8279 Family history of other congenital malformations, deformations and chromosomal abnormalities: Secondary | ICD-10-CM | POA: Insufficient documentation

## 2018-10-30 DIAGNOSIS — R079 Chest pain, unspecified: Secondary | ICD-10-CM

## 2018-10-30 DIAGNOSIS — R05 Cough: Secondary | ICD-10-CM

## 2018-10-30 DIAGNOSIS — R059 Cough, unspecified: Secondary | ICD-10-CM

## 2018-10-30 MED ORDER — NITROGLYCERIN 0.4 MG SL SUBL: 0.8000 mg | SUBLINGUAL_TABLET | SUBLINGUAL | Status: DC | PRN

## 2018-10-30 MED ORDER — NITROGLYCERIN 0.4 MG SL SUBL
SUBLINGUAL_TABLET | SUBLINGUAL | Status: AC
  Administered 2018-10-30: 0.8 mg
  Filled 2018-10-30: qty 2

## 2018-10-30 MED ORDER — IOHEXOL 350 MG/ML SOLN
90.0000 mL | Freq: Once | INTRAVENOUS | Status: AC | PRN
  Administered 2018-10-30: 14:00:00 90 mL via INTRAVENOUS

## 2018-12-16 ENCOUNTER — Telehealth: Payer: Self-pay | Admitting: Internal Medicine

## 2018-12-16 DIAGNOSIS — I10 Essential (primary) hypertension: Secondary | ICD-10-CM

## 2018-12-16 DIAGNOSIS — Z0001 Encounter for general adult medical examination with abnormal findings: Secondary | ICD-10-CM

## 2018-12-16 DIAGNOSIS — E782 Mixed hyperlipidemia: Secondary | ICD-10-CM

## 2018-12-16 NOTE — Telephone Encounter (Signed)
Patient has appt with Dr. Harrington Challenger on 10/30, he states he normally get labs done prior to his appt.  He would like to have an order placed for those and scheduled the Monday before his appt.

## 2019-01-08 NOTE — Telephone Encounter (Signed)
Placed orders for labs per Dr. Harrington Challenger.  Will send message to scheduling to call patient to schedule lab appointment.

## 2019-01-14 ENCOUNTER — Other Ambulatory Visit: Payer: Self-pay | Admitting: Internal Medicine

## 2019-01-22 ENCOUNTER — Other Ambulatory Visit: Payer: Self-pay

## 2019-01-22 ENCOUNTER — Other Ambulatory Visit: Payer: Medicare HMO | Admitting: *Deleted

## 2019-01-22 DIAGNOSIS — E782 Mixed hyperlipidemia: Secondary | ICD-10-CM

## 2019-01-22 DIAGNOSIS — I10 Essential (primary) hypertension: Secondary | ICD-10-CM

## 2019-01-22 DIAGNOSIS — Z0001 Encounter for general adult medical examination with abnormal findings: Secondary | ICD-10-CM

## 2019-01-22 LAB — BASIC METABOLIC PANEL
BUN/Creatinine Ratio: 17 (ref 10–24)
BUN: 12 mg/dL (ref 8–27)
CO2: 22 mmol/L (ref 20–29)
Calcium: 9.6 mg/dL (ref 8.6–10.2)
Chloride: 101 mmol/L (ref 96–106)
Creatinine, Ser: 0.69 mg/dL — ABNORMAL LOW (ref 0.76–1.27)
GFR calc Af Amer: 110 mL/min/{1.73_m2} (ref >59)
GFR calc non Af Amer: 96 mL/min/{1.73_m2} (ref >59)
Glucose: 104 mg/dL — ABNORMAL HIGH (ref 65–99)
Potassium: 4.4 mmol/L (ref 3.5–5.2)
Sodium: 139 mmol/L (ref 134–144)

## 2019-01-22 LAB — TSH: TSH: 2.11 u[IU]/mL (ref 0.450–4.500)

## 2019-01-22 LAB — LIPID PANEL
Chol/HDL Ratio: 2.6 ratio (ref 0.0–5.0)
Cholesterol, Total: 123 mg/dL (ref 100–199)
HDL: 48 mg/dL (ref >39)
LDL Chol Calc (NIH): 55 mg/dL (ref 0–99)
Triglycerides: 107 mg/dL (ref 0–149)
VLDL Cholesterol Cal: 20 mg/dL (ref 5–40)

## 2019-01-22 LAB — CBC
Hematocrit: 44.3 % (ref 37.5–51.0)
Hemoglobin: 14.1 g/dL (ref 13.0–17.7)
MCH: 30.6 pg (ref 26.6–33.0)
MCHC: 31.8 g/dL (ref 31.5–35.7)
MCV: 96 fL (ref 79–97)
Platelets: 201 10*3/uL (ref 150–450)
RBC: 4.61 x10E6/uL (ref 4.14–5.80)
RDW: 13.2 % (ref 11.6–15.4)
WBC: 6.4 10*3/uL (ref 3.4–10.8)

## 2019-01-22 LAB — PSA: Prostate Specific Ag, Serum: 2.1 ng/mL (ref 0.0–4.0)

## 2019-03-05 NOTE — Progress Notes (Signed)
Cardiology Office Note   Date:  03/06/2019   ID:  Dailey Buccheri, DOB May 06, 1946, MRN [id number]  PCP:  Eartha Inch, MD  Cardiologist:   Dietrich Pates, MD   F/U of HTN   History of Present Illness: Ruben Sosa is a 73 y.o. male with a history of HTN, HL and tremor   I saw him in clinic in Sept 2019   He was seen in May 2020 by Lonell Face for chest tightness    Atypical but concerning    Echo done when was normal   CXR normal   PT went on to have CT coronary arngiogram   Calcium score was 18.9   Angiogram showed mild nonobstructive CAD  Thoracic aorta measured 42 mm     September had labs   LDL 55  HDL 48  The pt is doing OK  Very stressful year since wife died Still has symptoms of bitter taste in mouth   Also wonders if related to sugarless gum       He is active    WOrks in yard and around house   No symptoms of chest discomfort with this     Current Meds  Medication Sig  . Ascorbic Acid (VITAMIN C) 500 MG CAPS Take 500 mg by mouth daily.   . ASPIRIN EC PO Take 81 mg by mouth daily.  Marland Kitchen atorvastatin (LIPITOR) 20 MG tablet TAKE 1 TABLET BY MOUTH EVERY DAY  . bisoprolol-hydrochlorothiazide (ZIAC) 5-6.25 MG tablet TAKE 1 TABLET BY MOUTH EVERY DAY  . Coenzyme Q10 (CO Q 10) 100 MG CAPS Take 100 mg by mouth daily.  . fish oil-omega-3 fatty acids 1000 MG capsule Take 1 g by mouth daily.   Marland Kitchen Lysine 500 MG CAPS Take by mouth daily.  . Magnesium 250 MG TABS Take 250 mg by mouth daily.  . Multiple Vitamin (MULTIVITAMIN) capsule Take 2 capsules by mouth daily.   . Potassium 99 MG TABS Take 1 tablet by mouth daily.  . sildenafil (REVATIO) 20 MG tablet as directed.  . triamcinolone cream (KENALOG) 0.1 % Use as directed as needed  . vitamin E 400 UNIT capsule Take 400 Units by mouth daily.     Allergies:   Patient has no known allergies.   Past Medical History:  Diagnosis Date  . Familial tremor   . Hypercholesteremia   . Hypertension   . Ventral hernia     Past Surgical  History:  Procedure Laterality Date  . ACHILLES TENDON REPAIR    . APPENDECTOMY    . COLONOSCOPY W/ POLYPECTOMY    . ESOPHAGOGASTRODUODENOSCOPY  08/31/1998   with biopsies  . LARYNGOSCOPY     flexible fiberoptic  . MOUTH SURGERY    . status post laparoscopic repair of a ventral incisional hernia  1/9/  . vastectomy    . VENTRAL HERNIA REPAIR  05/15/2002     Social History:  The patient  reports that he has been smoking cigars. He has never used smokeless tobacco. He reports current alcohol use. He reports that he does not use drugs.   Family History:  The patient's family history includes Coronary artery disease in his father.    ROS:  Please see the history of present illness. All other systems are reviewed and  Negative to the above problem except as noted.    PHYSICAL EXAM: VS:  BP 118/64   Pulse (!) 54   Ht 6\' 2"  (1.88 m)   Wt 209 lb (  94.8 kg)   SpO2 97%   BMI 26.83 kg/m   GEN: Well nourished, well developed, in no acute distress  HEENT: normal  Neck: no JVD, carotid bruits, or masses Cardiac: RRR; no murmurs, rubs, or gallops,no edema  Respiratory:  clear to auscultation bilaterally, normal work of breathing GI: soft, nontender, nondistended, + BS  No hepatomegaly  MS: no deformity Moving all extremities   Skin: warm and dry, no rash Neuro:  Strength and sensation are intact Psych: euthymic mood, full affect   EKG:  EKG is ordered today.   Lipid Panel    Component Value Date/Time   CHOL 123 01/22/2019 0753   CHOL 234 (H) 11/12/2014 0823   TRIG 107 01/22/2019 0753   TRIG 212 (H) 11/12/2014 0823   HDL 48 01/22/2019 0753   HDL 42 11/12/2014 0823   CHOLHDL 2.6 01/22/2019 0753   CHOLHDL 3.3 11/23/2015 0932   VLDL 38 (H) 11/23/2015 0932   LDLCALC 55 01/22/2019 0753   LDLCALC 150 (H) 11/12/2014 0823      Wt Readings from Last 3 Encounters:  03/06/19 209 lb (94.8 kg)  09/17/18 201 lb (91.2 kg)  01/17/18 227 lb (103 kg)      ASSESSMENT AND PLAN:  1   Chest discomfort    Pt has vague symtpoms that are not related to activity    ? GI   I encouraged him to stop chewing gum to see if improves   Symtoms may have gotten some better with prevacid, again supporting GI  2   CAD   Minimal on CT scan as noted   Continue risk factor modificaton  3  HL  Keep on statin   Excellent control  4  HTN   BP is good   Keep on current regimen  5  Aortic dilitation   No signs of marfan's clincally (like his brother and probably mother had)   Aorta was 42 mm   WIll follow periodically     F/U in 1 year     Current medicines are reviewed at length with the patient today.  The patient does not have concerns regarding medicines.  Signed, Dorris Carnes, MD  03/06/2019 8:51 AM    [facility] [place], [place], [place]  [address] Phone: [phone]; Fax: [phone]

## 2019-03-06 ENCOUNTER — Other Ambulatory Visit: Payer: Self-pay

## 2019-03-06 ENCOUNTER — Encounter: Payer: Self-pay | Admitting: Internal Medicine

## 2019-03-06 ENCOUNTER — Ambulatory Visit: Payer: Medicare HMO | Admitting: Internal Medicine

## 2019-03-06 VITALS — BP 118/64 | HR 54 | Ht 74.0 in | Wt 209.0 lb

## 2019-03-06 DIAGNOSIS — E782 Mixed hyperlipidemia: Secondary | ICD-10-CM

## 2019-03-06 DIAGNOSIS — I2510 Atherosclerotic heart disease of native coronary artery without angina pectoris: Secondary | ICD-10-CM

## 2019-03-06 NOTE — Patient Instructions (Signed)
Medication Instructions:  No changes *If you need a refill on your cardiac medications before your next appointment, please call your pharmacy*  Lab Work: none If you have labs (blood work) drawn today and your tests are completely normal, you will receive your results only by: . MyChart Message (if you have MyChart) OR . A paper copy in the mail If you have any lab test that is abnormal or we need to change your treatment, we will call you to review the results.  Testing/Procedures: none  Follow-Up: At [facility], you and your health needs are our priority.  As part of our continuing mission to provide you with exceptional heart care, we have created designated Provider Care Teams.  These Care Teams include your primary Cardiologist (physician) and Advanced Practice Providers (APPs -  Physician Assistants and Nurse Practitioners) who all work together to provide you with the care you need, when you need it.  Your next appointment:   12 months  The format for your next appointment:   In Person  Provider:   Paula Ross, MD  Other Instructions   

## 2019-05-08 HISTORY — PX: COLONOSCOPY WITH ESOPHAGOGASTRODUODENOSCOPY (EGD): SHX5779

## 2019-09-15 ENCOUNTER — Other Ambulatory Visit: Payer: Self-pay | Admitting: Orthopaedic Surgery

## 2019-09-15 DIAGNOSIS — M25512 Pain in left shoulder: Secondary | ICD-10-CM

## 2019-09-24 ENCOUNTER — Ambulatory Visit
Admission: RE | Admit: 2019-09-24 | Discharge: 2019-09-24 | Disposition: A | Discharge status: Home / Self Care | Payer: Medicare HMO | Source: Ambulatory Visit | Attending: Orthopaedic Surgery | Admitting: Orthopaedic Surgery

## 2019-09-24 DIAGNOSIS — M25512 Pain in left shoulder: Secondary | ICD-10-CM

## 2019-09-30 ENCOUNTER — Telehealth: Payer: Self-pay | Admitting: *Deleted

## 2019-09-30 NOTE — Telephone Encounter (Signed)
   [facility] Pre-operative Risk Assessment    HEARTCARE STAFF: - Please ensure there is not already an duplicate clearance open for this procedure. - Under Visit Info/Reason for Call, type in Other and utilize the format Clearance MM/DD/YY or Clearance TBD. Do not use dashes or single digits. - If request is for dental extraction, please clarify the # of teeth to be extracted.  Request for surgical clearance:  1. What type of surgery is being performed? LEFT TOTAL SHOULDER REPLACEMENT    2. When is this surgery scheduled? TBD   3. What type of clearance is required (medical clearance vs. Pharmacy clearance to hold med vs. Both)? MEDICAL  4. Are there any medications that need to be held prior to surgery and how long? ASA    5. Practice name and name of physician performing surgery? [facility]; DR. DAX VARKEY   6. What is the office phone number? [phone] EXT [phone]   7.   What is the office fax number?  [place].   Anesthesia type (None, local, MAC, general) ? CHOICE   Julaine Hua 09/30/2019, 10:27 AM  _________________________________________________________________   (provider comments below)

## 2019-09-30 NOTE — Telephone Encounter (Signed)
Primary Cardiologist:Paula Tenny Craw, MD  Chart reviewed as part of pre-operative protocol coverage. Because of Ruben Sosa's past medical history and time since last visit, he/she will require a follow-up visit in order to better assess preoperative cardiovascular risk.  Pre-op covering staff: - Please schedule appointment and call patient to inform them. - Please contact requesting surgeon's office via preferred method (i.e, phone, fax) to inform them of need for appointment prior to surgery.  If applicable, this message will also be routed to pharmacy pool and/or primary cardiologist for input on holding anticoagulant/antiplatelet agent as requested below so that this information is available at time of patient's appointment.   Ronney Asters, NP  09/30/2019, 11:06 AM

## 2019-10-02 NOTE — Telephone Encounter (Signed)
   Primary Cardiologist: Dietrich Pates, MD  Chart reviewed as part of pre-operative protocol coverage. Patient was contacted 10/02/2019 in reference to pre-operative risk assessment for pending surgery as outlined below. He reports his surgery will not occur until 02/2020.   Callback: - Please arrange a visit with Dr. Tenny Craw 01/2020 for preoperative assessment.   Beatriz Stallion, PA-C 10/02/2019, 4:08 PM

## 2019-10-02 NOTE — Telephone Encounter (Signed)
I s/w the Ruben Sosa and he is agreeable to set up appt up for 01/29/20 with Dr. Tenny Craw for pre op clearance appt. Ruben Sosa would like to have lab work done somewhere between the 9/8 and 9/10 as he will be going out of town as of the 11th and will not be back in town until 9/21. I assured the Ruben Sosa that I wll send a message to Dr. Charlott Rakes nurse in regards to the lab work he is requesting. Assured the Ruben Sosa that Michalene will call him back when she is back in the office. Ruben Sosa thanked me for the help.

## 2019-10-12 ENCOUNTER — Other Ambulatory Visit: Payer: Self-pay | Admitting: Internal Medicine

## 2019-11-19 ENCOUNTER — Other Ambulatory Visit: Payer: Self-pay | Admitting: Internal Medicine

## 2019-12-23 ENCOUNTER — Telehealth: Payer: Self-pay | Admitting: *Deleted

## 2019-12-23 ENCOUNTER — Other Ambulatory Visit: Payer: Self-pay | Admitting: *Deleted

## 2019-12-23 DIAGNOSIS — I10 Essential (primary) hypertension: Secondary | ICD-10-CM

## 2019-12-23 DIAGNOSIS — E782 Mixed hyperlipidemia: Secondary | ICD-10-CM

## 2019-12-23 DIAGNOSIS — Z0001 Encounter for general adult medical examination with abnormal findings: Secondary | ICD-10-CM

## 2019-12-23 DIAGNOSIS — I2510 Atherosclerotic heart disease of native coronary artery without angina pectoris: Secondary | ICD-10-CM

## 2019-12-23 NOTE — Telephone Encounter (Signed)
Lab orders placed per Dr. Tenny Craw. Pt aware. Appointment scheduled.

## 2020-01-15 ENCOUNTER — Other Ambulatory Visit: Payer: Medicare HMO | Admitting: *Deleted

## 2020-01-15 ENCOUNTER — Other Ambulatory Visit: Payer: Self-pay

## 2020-01-15 DIAGNOSIS — Z0001 Encounter for general adult medical examination with abnormal findings: Secondary | ICD-10-CM

## 2020-01-15 DIAGNOSIS — I10 Essential (primary) hypertension: Secondary | ICD-10-CM

## 2020-01-15 DIAGNOSIS — E782 Mixed hyperlipidemia: Secondary | ICD-10-CM

## 2020-01-15 DIAGNOSIS — I2510 Atherosclerotic heart disease of native coronary artery without angina pectoris: Secondary | ICD-10-CM

## 2020-01-15 LAB — LIPID PANEL
Chol/HDL Ratio: 2.5 ratio (ref 0.0–5.0)
Cholesterol, Total: 127 mg/dL (ref 100–199)
HDL: 50 mg/dL (ref >39)
LDL Chol Calc (NIH): 58 mg/dL (ref 0–99)
Triglycerides: 103 mg/dL (ref 0–149)
VLDL Cholesterol Cal: 19 mg/dL (ref 5–40)

## 2020-01-15 LAB — BASIC METABOLIC PANEL
BUN/Creatinine Ratio: 13 (ref 10–24)
BUN: 9 mg/dL (ref 8–27)
CO2: 24 mmol/L (ref 20–29)
Calcium: 9.5 mg/dL (ref 8.6–10.2)
Chloride: 100 mmol/L (ref 96–106)
Creatinine, Ser: 0.69 mg/dL — ABNORMAL LOW (ref 0.76–1.27)
GFR calc Af Amer: 110 mL/min/{1.73_m2} (ref >59)
GFR calc non Af Amer: 95 mL/min/{1.73_m2} (ref >59)
Glucose: 95 mg/dL (ref 65–99)
Potassium: 4.3 mmol/L (ref 3.5–5.2)
Sodium: 137 mmol/L (ref 134–144)

## 2020-01-15 LAB — CBC
Hematocrit: 40.9 % (ref 37.5–51.0)
Hemoglobin: 13.8 g/dL (ref 13.0–17.7)
MCH: 30.3 pg (ref 26.6–33.0)
MCHC: 33.7 g/dL (ref 31.5–35.7)
MCV: 90 fL (ref 79–97)
Platelets: 280 10*3/uL (ref 150–450)
RBC: 4.55 x10E6/uL (ref 4.14–5.80)
RDW: 12.6 % (ref 11.6–15.4)
WBC: 6.2 10*3/uL (ref 3.4–10.8)

## 2020-01-15 LAB — PSA: Prostate Specific Ag, Serum: 4.2 ng/mL — ABNORMAL HIGH (ref 0.0–4.0)

## 2020-01-15 LAB — TSH: TSH: 2.22 u[IU]/mL (ref 0.450–4.500)

## 2020-01-15 LAB — AST: AST: 21 IU/L (ref 0–40)

## 2020-01-22 ENCOUNTER — Other Ambulatory Visit: Payer: Medicare HMO

## 2020-01-28 NOTE — Progress Notes (Signed)
Cardiology Office Note   Date:  01/30/2020   ID:  Ruben Sosa, DOB 1946/04/04, MRN [id number]  PCP:  Ruben Inch, MD  Cardiologist:   Ruben Pates, MD   F/U of HTN   History of Present Illness: Ruben Sosa is a 74 y.o. male with a history of HTN, HL and tremor    He was seen in May 2020 by Ruben Sosa for chest tightness    Atypical but concerning    Echo  was normal   CXR normal   PT went on to have CT coronary arngiogram   Calcium score was 18.9   Angiogram showed mild nonobstructive CAD  Thoracic aorta measured 42 mm    I saw the pt in Oct 2020  Since seen the pt hs done well  Breathing is OK   No CP   No dizziness PT active   Walks, golfs some  Being seen in ortho for L shoulder pain  Pt says it is actually doing better   Current Meds  Medication Sig  . Ascorbic Acid (VITAMIN C) 500 MG CAPS Take 500 mg by mouth daily.   . ASPIRIN EC PO Take 81 mg by mouth daily.  Marland Kitchen atorvastatin (LIPITOR) 20 MG tablet Take 1 tablet (20 mg total) by mouth daily.  . bisoprolol-hydrochlorothiazide (ZIAC) 5-6.25 MG tablet TAKE 1 TABLET BY MOUTH EVERY DAY  . Coenzyme Q10 (CO Q 10) 100 MG CAPS Take 100 mg by mouth daily.  . fish oil-omega-3 fatty acids 1000 MG capsule Take 1 g by mouth daily.   Marland Kitchen Lysine 500 MG CAPS Take by mouth daily.  . Magnesium 250 MG TABS Take 250 mg by mouth daily.  . Multiple Vitamin (MULTIVITAMIN) capsule Take 2 capsules by mouth daily.   . Potassium 99 MG TABS Take 1 tablet by mouth daily.  . vitamin E 400 UNIT capsule Take 400 Units by mouth daily.  . [DISCONTINUED] atorvastatin (LIPITOR) 20 MG tablet TAKE 1 TABLET BY MOUTH EVERY DAY     Allergies:   Patient has no known allergies.   Past Medical History:  Diagnosis Date  . Familial tremor   . Hypercholesteremia   . Hypertension   . Ventral hernia     Past Surgical History:  Procedure Laterality Date  . ACHILLES TENDON REPAIR    . APPENDECTOMY    . COLONOSCOPY W/ POLYPECTOMY    .  ESOPHAGOGASTRODUODENOSCOPY  08/31/1998   with biopsies  . LARYNGOSCOPY     flexible fiberoptic  . MOUTH SURGERY    . status post laparoscopic repair of a ventral incisional hernia  1/9/  . vastectomy    . VENTRAL HERNIA REPAIR  05/15/2002     Social History:  The patient  reports that he has been smoking cigars. He has never used smokeless tobacco. He reports current alcohol use. He reports that he does not use drugs.   Family History:  The patient's family history includes Coronary artery disease in his father.    ROS:  Please see the history of present illness. All other systems are reviewed and  Negative to the above problem except as noted.    PHYSICAL EXAM: VS:  BP 132/82   Pulse (!) 56   Ht 6\' 2"  (1.88 m)   Wt 210 lb (95.3 kg)   BMI 26.96 kg/m   GEN: Well nourished, well developed, in no acute distress  HEENT: normal  Neck: no JVD, carotid bruits Cardiac: RRR; no murmurs; no LE  edema  Respiratory:  clear to auscultation bilaterally, GI: soft, nontender, nondistended, + BS  No hepatomegaly  MS: no deformity Moving all extremities   Skin: warm and dry, no rash Neuro:  Strength and sensation are intact Psych: euthymic mood, full affect   EKG:  EKG is ordered today.  Sinus bradycardia 56 bpm  Nonspecific ST changes    Lipid Panel    Component Value Date/Time   CHOL 127 01/15/2020 0813   CHOL 234 (H) 11/12/2014 0823   TRIG 103 01/15/2020 0813   TRIG 212 (H) 11/12/2014 0823   HDL 50 01/15/2020 0813   HDL 42 11/12/2014 0823   CHOLHDL 2.5 01/15/2020 0813   CHOLHDL 3.3 11/23/2015 0932   VLDL 38 (H) 11/23/2015 0932   LDLCALC 58 01/15/2020 0813   LDLCALC 150 (H) 11/12/2014 0823      Wt Readings from Last 3 Encounters:  01/29/20 210 lb (95.3 kg)  03/06/19 209 lb (94.8 kg)  09/17/18 201 lb (91.2 kg)      ASSESSMENT AND PLAN:  1  Hx CAD   Minimal on CT scan  Pt asymptomatic   2  HL   Recent lipids are very good   Statin has been titrated in past   Lipomed had  higher particles  I would keep on same regimen   3  HTN   BP is adequately controlled  4   Aortic dilitation   Thoracic aorta is minimally dilated  42 mm on CT scan   Will follow up in future   5  PSA   PSA 4.2  WIll repeat today    F/U in 1 year     Current medicines are reviewed at length with the patient today.  The patient does not have concerns regarding medicines.  Signed, Ruben Pates, MD  01/30/2020 5:27 PM    [facility] [address] [place], [place], [place]  [address] Phone: [phone]; Fax: [phone]

## 2020-01-29 ENCOUNTER — Encounter: Payer: Self-pay | Admitting: Internal Medicine

## 2020-01-29 ENCOUNTER — Ambulatory Visit: Payer: Medicare HMO | Admitting: Internal Medicine

## 2020-01-29 ENCOUNTER — Other Ambulatory Visit: Payer: Self-pay

## 2020-01-29 VITALS — BP 132/82 | HR 56 | Ht 74.0 in | Wt 210.0 lb

## 2020-01-29 DIAGNOSIS — R972 Elevated prostate specific antigen [PSA]: Secondary | ICD-10-CM | POA: Diagnosis not present

## 2020-01-29 LAB — PSA: Prostate Specific Ag, Serum: 3.9 ng/mL (ref 0.0–4.0)

## 2020-01-29 MED ORDER — ATORVASTATIN CALCIUM 20 MG PO TABS
20.0000 mg | ORAL_TABLET | Freq: Every day | ORAL | 3 refills | Status: DC
Start: 2020-01-29 — End: 2021-01-02

## 2020-01-29 NOTE — Patient Instructions (Signed)
Medication Instructions:  No changes *If you need a refill on your cardiac medications before your next appointment, please call your pharmacy*   Lab Work: Today: PSA If you have labs (blood work) drawn today and your tests are completely normal, you will receive your results only by: Marland Kitchen MyChart Message (if you have MyChart) OR . A paper copy in the mail If you have any lab test that is abnormal or we need to change your treatment, we will call you to review the results.   Testing/Procedures: none   Follow-Up: At [facility], you and your health needs are our priority.  As part of our continuing mission to provide you with exceptional heart care, we have created designated Provider Care Teams.  These Care Teams include your primary Cardiologist (physician) and Advanced Practice Providers (APPs -  Physician Assistants and Nurse Practitioners) who all work together to provide you with the care you need, when you need it.  Your next appointment:   12 month(s)  The format for your next appointment:   In Person  Provider:   You may see Dietrich Pates, MD or one of the following Advanced Practice Providers on your designated Care Team:    Tereso Newcomer, PA-C  Chelsea Aus, [place]    Other Instructions

## 2020-02-02 NOTE — Addendum Note (Signed)
Addended by: Adele Schilder on: 02/02/2020 06:54 AM   Modules accepted: Orders

## 2020-02-23 ENCOUNTER — Other Ambulatory Visit: Payer: Self-pay

## 2020-02-23 MED ORDER — BISOPROLOL-HYDROCHLOROTHIAZIDE 5-6.25 MG PO TABS: 1.0000 | ORAL_TABLET | Freq: Every day | ORAL | 3 refills | Status: DC

## 2020-02-23 NOTE — Telephone Encounter (Signed)
Pt's medication was sent to pt's pharmacy as requested. Confirmation received.  °

## 2020-03-02 ENCOUNTER — Other Ambulatory Visit: Payer: Self-pay | Admitting: Urology

## 2020-03-02 DIAGNOSIS — R972 Elevated prostate specific antigen [PSA]: Secondary | ICD-10-CM

## 2020-03-28 ENCOUNTER — Other Ambulatory Visit: Payer: Self-pay

## 2020-03-28 ENCOUNTER — Ambulatory Visit
Admission: RE | Admit: 2020-03-28 | Discharge: 2020-03-28 | Disposition: A | Discharge status: Home / Self Care | Payer: Medicare HMO | Source: Ambulatory Visit | Attending: Urology | Admitting: Urology

## 2020-03-28 DIAGNOSIS — R972 Elevated prostate specific antigen [PSA]: Secondary | ICD-10-CM

## 2020-03-28 MED ORDER — GADOBENATE DIMEGLUMINE 529 MG/ML IV SOLN
20.0000 mL | Freq: Once | INTRAVENOUS | Status: AC | PRN
  Administered 2020-03-28: 20 mL via INTRAVENOUS

## 2020-05-19 IMAGING — CT CT HEAR MORPH WITH CTA COR WITH SCORE WITH CA WITH CONTRAST AND
4 of 7 series · 8 of 20 positions shown, 9 images · non-contrast
Comparison: None.
COMPARISON: None.

Addendum:
EXAM:
OVER-READ INTERPRETATION  CT CHEST

The following report is an over-read performed by radiologist Dr.
over-read does not include interpretation of cardiac or coronary
anatomy or pathology. The coronary CT a interpretation by the
cardiologist is attached.
CLINICAL DATA: Chest pain
Cardiac CTA
MEDICATIONS:
Sub lingual nitro. 4mg x 2
TECHNIQUE: The patient was scanned on a Siemens [REDACTED]ice scanner. Gantry
rotation speed was 250 msecs. Collimation was 0.6 mm. A 100 kV
prospective scan was triggered in the ascending thoracic aorta at
35-75% of the R-R interval. Average HR during the scan was 60 bpm.
The 3D data set was interpreted on a dedicated work station using
MPR, MIP and VRT modes. A total of 80cc of contrast was used.

[Series 6: best diast 69 % · axial · 0.39mm/px · z∈[+1095,+1170]mm · 2 of 566 slices shown, 3 images]
[im 189/566  vessel]
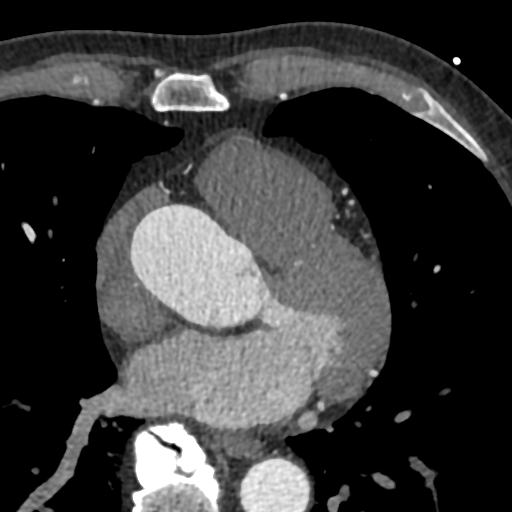
[im 189/566  lung]
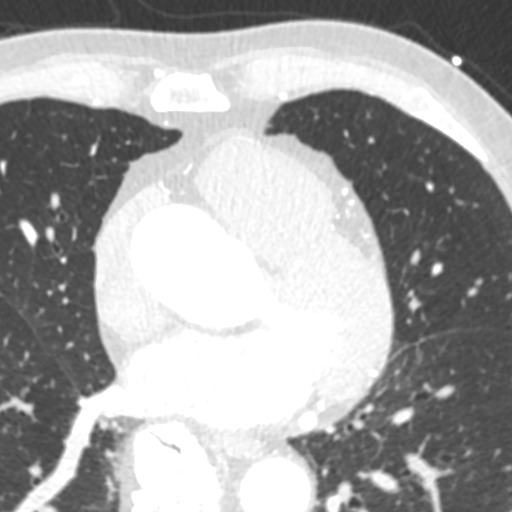
[im 377/566  vessel]
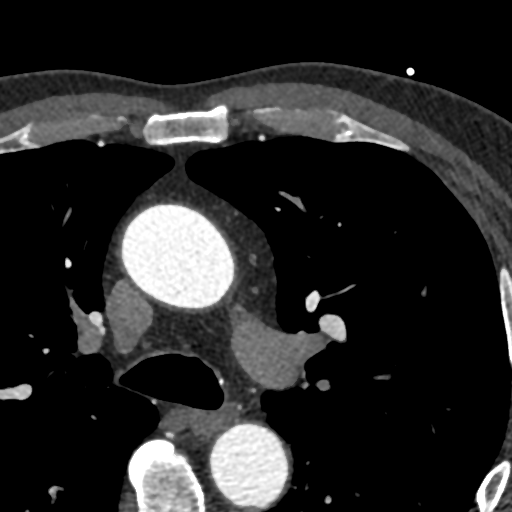

[Series 7: best syst 41 % · axial · 0.39mm/px · z∈[+1095,+1170]mm · 2 of 566 slices shown]
[im 189/566  vessel]
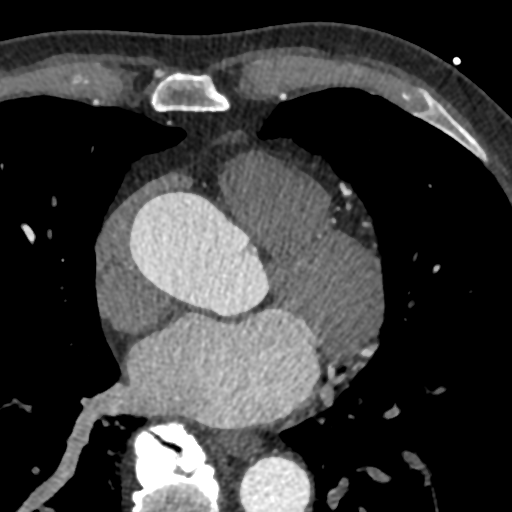
[im 377/566  vessel]
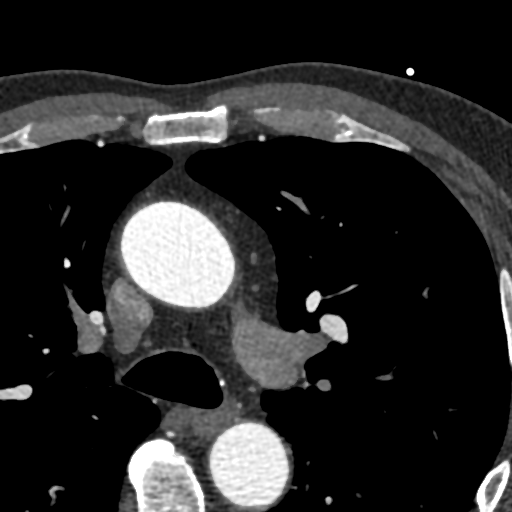

[Series 8: ts diast sharp 69 % · axial · 0.39mm/px · z∈[+1095,+1170]mm · 2 of 566 slices shown]
[im 189/566  lung]
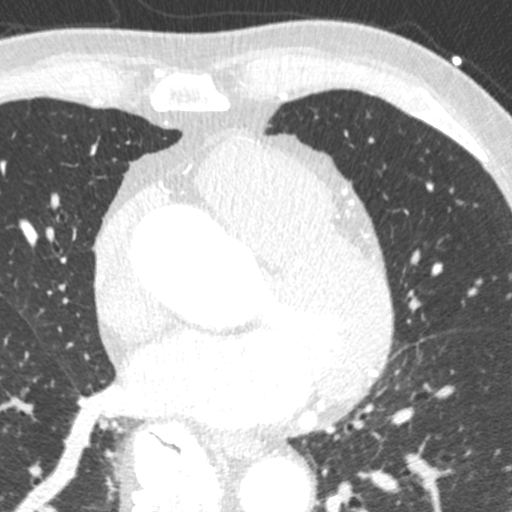
[im 377/566  lung]
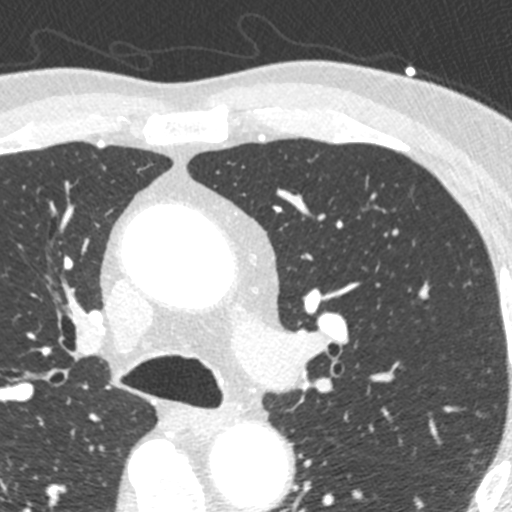

[Series 9: ts syst sharp 41 % · axial · 0.39mm/px · z∈[+1095,+1170]mm · 2 of 566 slices shown]
[im 189/566  lung]
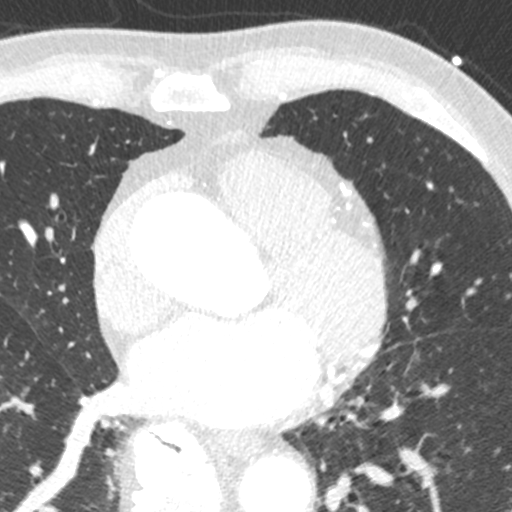
[im 377/566  lung]
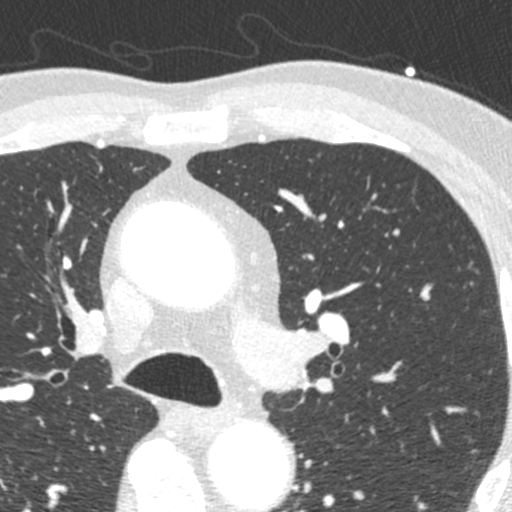

[8 of 20 positions shown; findings below may reference images not displayed]

FINDINGS: Vascular: Ascending thoracic aorta measures up to 4.2 cm diameter.

Mediastinum/Nodes: No mediastinal lymphadenopathy. There is no hilar
lymphadenopathy. Image portions of the esophagus are unremarkable.

Lungs/Pleura: Calcified granuloma noted in the lingula. No
suspicious nodule or mass.

Upper Abdomen: Unremarkable.

Musculoskeletal: No worrisome lytic or sclerotic osseous
abnormality.
IMPRESSION: 4.2 cm diameter ascending thoracic aorta Recommend annual imaging
followup by CTA or MRA. This recommendation follows 9737
ACCF/AHA/AATS/ACR/ASA/SCA/CODABACCUS/LIXSON/KNUTH/RHONAL Guidelines for the
Diagnosis and Management of Patients with Thoracic Aortic Disease.
Circulation. 9737; 121: E266-e369. Aortic aneurysm NOS (X9NET-NWH.5)
FINDINGS: Non-cardiac: See separate report from [REDACTED].

Pulmonary veins drain normally to the left atrium.

Calcium Score: 18.9 Agatston units.

Coronary Arteries: Right dominant with no anomalies

LM: No plaque or stenosis.

LAD system: Mixed plaque proximal LAD with minimal stenosis.

Circumflex system: No plaque or stenosis.

RCA system: Mixed plaque with minimal stenosis in the mid RCA.
IMPRESSION: 1. Coronary artery calcium score 18.9 Agatston units. This places
the patient in the 22nd percentile for age and gender, suggesting
low risk for future cardiac events.

2.  Nonobstructive mild CAD.

Meerul Sopi

*** End of Addendum ***
EXAM:
OVER-READ INTERPRETATION  CT CHEST

The following report is an over-read performed by radiologist Dr.
over-read does not include interpretation of cardiac or coronary
anatomy or pathology. The coronary CT a interpretation by the
cardiologist is attached.
FINDINGS: Vascular: Ascending thoracic aorta measures up to 4.2 cm diameter.

Mediastinum/Nodes: No mediastinal lymphadenopathy. There is no hilar
lymphadenopathy. Image portions of the esophagus are unremarkable.

Lungs/Pleura: Calcified granuloma noted in the lingula. No
suspicious nodule or mass.

Upper Abdomen: Unremarkable.

Musculoskeletal: No worrisome lytic or sclerotic osseous
abnormality.
IMPRESSION: 4.2 cm diameter ascending thoracic aorta Recommend annual imaging
followup by CTA or MRA. This recommendation follows 9737
ACCF/AHA/AATS/ACR/ASA/SCA/CODABACCUS/LIXSON/KNUTH/RHONAL Guidelines for the
Diagnosis and Management of Patients with Thoracic Aortic Disease.
Circulation. 9737; 121: E266-e369. Aortic aneurysm NOS (X9NET-NWH.5)

## 2020-12-31 ENCOUNTER — Other Ambulatory Visit: Payer: Self-pay | Admitting: Internal Medicine

## 2021-01-24 ENCOUNTER — Other Ambulatory Visit: Payer: Self-pay

## 2021-01-24 ENCOUNTER — Other Ambulatory Visit: Payer: Medicare HMO

## 2021-01-24 ENCOUNTER — Telehealth: Payer: Self-pay | Admitting: *Deleted

## 2021-01-24 DIAGNOSIS — E782 Mixed hyperlipidemia: Secondary | ICD-10-CM

## 2021-01-24 DIAGNOSIS — Z0001 Encounter for general adult medical examination with abnormal findings: Secondary | ICD-10-CM

## 2021-01-24 DIAGNOSIS — I10 Essential (primary) hypertension: Secondary | ICD-10-CM

## 2021-01-24 DIAGNOSIS — I2510 Atherosclerotic heart disease of native coronary artery without angina pectoris: Secondary | ICD-10-CM

## 2021-01-24 DIAGNOSIS — R972 Elevated prostate specific antigen [PSA]: Secondary | ICD-10-CM

## 2021-01-24 LAB — BASIC METABOLIC PANEL
BUN/Creatinine Ratio: 15 (ref 10–24)
BUN: 11 mg/dL (ref 8–27)
CO2: 24 mmol/L (ref 20–29)
Calcium: 9.9 mg/dL (ref 8.6–10.2)
Chloride: 100 mmol/L (ref 96–106)
Creatinine, Ser: 0.71 mg/dL — ABNORMAL LOW (ref 0.76–1.27)
Glucose: 93 mg/dL (ref 65–99)
Potassium: 4.5 mmol/L (ref 3.5–5.2)
Sodium: 139 mmol/L (ref 134–144)
eGFR: 97 mL/min/{1.73_m2} (ref >59)

## 2021-01-24 LAB — CBC
Hematocrit: 41.9 % (ref 37.5–51.0)
Hemoglobin: 14.3 g/dL (ref 13.0–17.7)
MCH: 30.8 pg (ref 26.6–33.0)
MCHC: 34.1 g/dL (ref 31.5–35.7)
MCV: 90 fL (ref 79–97)
Platelets: 210 10*3/uL (ref 150–450)
RBC: 4.65 x10E6/uL (ref 4.14–5.80)
RDW: 12.8 % (ref 11.6–15.4)
WBC: 6.3 10*3/uL (ref 3.4–10.8)

## 2021-01-24 LAB — LIPID PANEL
Chol/HDL Ratio: 3.3 ratio (ref 0.0–5.0)
Cholesterol, Total: 148 mg/dL (ref 100–199)
HDL: 45 mg/dL (ref >39)
LDL Chol Calc (NIH): 79 mg/dL (ref 0–99)
Triglycerides: 134 mg/dL (ref 0–149)
VLDL Cholesterol Cal: 24 mg/dL (ref 5–40)

## 2021-01-24 LAB — PSA: Prostate Specific Ag, Serum: 2.9 ng/mL (ref 0.0–4.0)

## 2021-01-24 LAB — AST: AST: 27 IU/L (ref 0–40)

## 2021-01-24 LAB — TSH: TSH: 2.57 u[IU]/mL (ref 0.450–4.500)

## 2021-01-24 NOTE — Telephone Encounter (Signed)
Pt coming tomorrow for ov with Dr. Tenny Craw, lab orders placed per MD for today.

## 2021-01-25 ENCOUNTER — Encounter: Payer: Self-pay | Admitting: Internal Medicine

## 2021-01-25 ENCOUNTER — Ambulatory Visit: Payer: Medicare HMO | Admitting: Internal Medicine

## 2021-01-25 DIAGNOSIS — I2510 Atherosclerotic heart disease of native coronary artery without angina pectoris: Secondary | ICD-10-CM

## 2021-01-25 MED ORDER — BISOPROLOL-HYDROCHLOROTHIAZIDE 5-6.25 MG PO TABS
1.0000 | ORAL_TABLET | Freq: Every day | ORAL | 3 refills | Status: DC
Start: 2021-01-25 — End: 2022-01-10

## 2021-01-25 NOTE — Patient Instructions (Signed)
Medication Instructions:  No changes *If you need a refill on your cardiac medications before your next appointment, please call your pharmacy*   Lab Work: None today If you have labs (blood work) drawn today and your tests are completely normal, you will receive your results only by: MyChart Message (if you have MyChart) OR A paper copy in the mail If you have any lab test that is abnormal or we need to change your treatment, we will call you to review the results.   Testing/Procedures: none   Follow-Up: At [facility], you and your health needs are our priority.  As part of our continuing mission to provide you with exceptional heart care, we have created designated Provider Care Teams.  These Care Teams include your primary Cardiologist (physician) and Advanced Practice Providers (APPs -  Physician Assistants and Nurse Practitioners) who all work together to provide you with the care you need, when you need it.    Your next appointment:   12 month(s)  The format for your next appointment:   In Person  Provider:   You may see Dietrich Pates, MD or one of the following Advanced Practice Providers on your designated Care Team:   Tereso Newcomer, PA-C Chelsea Aus, [place]   Other Instructions

## 2021-01-25 NOTE — Progress Notes (Signed)
Cardiology Office Note   Date:  01/25/2021   ID:  Ruben Sosa, DOB November 22, 1945, MRN [id number]  PCP:  Eartha Inch, MD  Cardiologist:   Dietrich Pates, MD   F/U of HTN   History of Present Illness: Ruben Sosa is a 75 y.o. male with a history of HTN, HL and tremor    He was seen in May 2020 for chest discomfort   Echo  was normal   CXR normal   PT went on to have CT coronary arngiogram   Calcium score was 18.9   CT angiogram showed mild nonobstructive CAD  Thoracic aorta measured 42 mm    I saw the 1year ago  Breaghing is good   No CP   No dizziness    Still having problems with shoulder    Current Meds  Medication Sig   Ascorbic Acid (VITAMIN C) 500 MG CAPS Take 500 mg by mouth daily.    ASPIRIN EC PO Take 81 mg by mouth daily.   atorvastatin (LIPITOR) 20 MG tablet TAKE 1 TABLET BY MOUTH EVERY DAY   Coenzyme Q10 (CO Q 10) 100 MG CAPS Take 100 mg by mouth daily.   fish oil-omega-3 fatty acids 1000 MG capsule Take 1 g by mouth daily.    Lysine 500 MG CAPS Take by mouth daily.   Magnesium 250 MG TABS Take 250 mg by mouth daily.   Multiple Vitamin (MULTIVITAMIN) capsule Take 2 capsules by mouth daily.    Niacin (VITAMIN B-3 PO) Take by mouth.   Potassium 99 MG TABS Take 1 tablet by mouth daily.   Turmeric (QC TUMERIC COMPLEX PO) Take by mouth.   vitamin E 400 UNIT capsule Take 400 Units by mouth daily.   [DISCONTINUED] bisoprolol-hydrochlorothiazide (ZIAC) 5-6.25 MG tablet Take 1 tablet by mouth daily.     Allergies:   Patient has no known allergies.   Past Medical History:  Diagnosis Date   Familial tremor    Hypercholesteremia    Hypertension    Ventral hernia     Past Surgical History:  Procedure Laterality Date   ACHILLES TENDON REPAIR     APPENDECTOMY     COLONOSCOPY W/ POLYPECTOMY     ESOPHAGOGASTRODUODENOSCOPY  08/31/1998   with biopsies   LARYNGOSCOPY     flexible fiberoptic   MOUTH SURGERY     status post laparoscopic repair of a ventral incisional  hernia  1/9/   vastectomy     VENTRAL HERNIA REPAIR  05/15/2002     Social History:  The patient  reports that he has been smoking cigars. He has never used smokeless tobacco. He reports current alcohol use. He reports that he does not use drugs.   Family History:  The patient's family history includes Coronary artery disease in his father.    ROS:  Please see the history of present illness. All other systems are reviewed and  Negative to the above problem except as noted.    PHYSICAL EXAM: VS:    BP 120/   P   56    GEN: Well nourished, well developed, in no acute distress  HEENT: normal  Neck: no JVD, no carotid bruits Cardiac: RRR; no murmurs; no LE edema  Respiratory:  clear to auscultation bilaterally, GI: soft, nontender, nondistended, + BS  No hepatomegaly  MS: no deformity Moving all extremities   Skin: warm and dry, no rash Neuro:  Strength and sensation are intact Psych: euthymic mood, full affect  EKG:  EKG is ordered today.  Sinus bradycardia 56 bpm  Nonspecific ST changes    Lipid Panel    Component Value Date/Time   CHOL 148 01/24/2021 1037   CHOL 234 (H) 11/12/2014 0823   TRIG 134 01/24/2021 1037   TRIG 212 (H) 11/12/2014 0823   HDL 45 01/24/2021 1037   HDL 42 11/12/2014 0823   CHOLHDL 3.3 01/24/2021 1037   CHOLHDL 3.3 11/23/2015 0932   VLDL 38 (H) 11/23/2015 0932   LDLCALC 79 01/24/2021 1037   LDLCALC 150 (H) 11/12/2014 0823      Wt Readings from Last 3 Encounters:  01/29/20 210 lb (95.3 kg)  03/06/19 209 lb (94.8 kg)  09/17/18 201 lb (91.2 kg)      ASSESSMENT AND PLAN:  1  Hx CAD   Minimal on CT scan  Pt remains  asymptomatic   2  HL   Lipids good but have been better in the past  Watch fats, sallts  3  HTN   BP is adequately controlled  Keep on same meds     4   Aortic dilitation   Thoracic aorta is minimally dilated  42 mm on CT scan   Will follow up in future      F/U in 1 year     Current medicines are reviewed at length with  the patient today.  The patient does not have concerns regarding medicines.  Signed, Dietrich Pates, MD  01/25/2021 10:47 PM    [facility] [address] [place], [place], [place]  [address] Phone: [phone]; Fax: [phone]

## 2021-03-17 ENCOUNTER — Ambulatory Visit: Payer: Medicare HMO | Admitting: Internal Medicine

## 2021-09-21 ENCOUNTER — Telehealth: Payer: Self-pay | Admitting: Internal Medicine

## 2021-09-21 DIAGNOSIS — I729 Aneurysm of unspecified site: Secondary | ICD-10-CM | POA: Insufficient documentation

## 2021-09-21 DIAGNOSIS — Z86010 Personal history of colonic polyps: Secondary | ICD-10-CM | POA: Insufficient documentation

## 2021-09-21 DIAGNOSIS — Z01810 Encounter for preprocedural cardiovascular examination: Secondary | ICD-10-CM

## 2021-09-21 DIAGNOSIS — I779 Disorder of arteries and arterioles, unspecified: Secondary | ICD-10-CM

## 2021-09-21 NOTE — Telephone Encounter (Signed)
Patient seen by PCP today   Note he had mild dilation of the aorta on CT in 2020   Needs Follow up CTA (gated) to reevaluate size.  Please schedule

## 2021-09-22 NOTE — Telephone Encounter (Signed)
Spoke with the pt and he agreed to a CT for his Aorta... I also made him a late summer appt since he will be due for follow up.

## 2021-09-22 NOTE — Addendum Note (Signed)
Addended by: Bertram Millard on: 09/22/2021 08:28 AM   Modules accepted: Orders

## 2021-09-22 NOTE — Addendum Note (Signed)
Addended by: Bertram Millard on: 09/22/2021 04:11 PM   Modules accepted: Orders

## 2021-10-04 ENCOUNTER — Other Ambulatory Visit: Payer: Medicare HMO | Admitting: *Deleted

## 2021-10-04 LAB — BASIC METABOLIC PANEL
BUN/Creatinine Ratio: 13 (ref 10–24)
BUN: 9 mg/dL (ref 8–27)
CO2: 24 mmol/L (ref 20–29)
Calcium: 9.5 mg/dL (ref 8.6–10.2)
Chloride: 99 mmol/L (ref 96–106)
Creatinine, Ser: 0.72 mg/dL — ABNORMAL LOW (ref 0.76–1.27)
Glucose: 99 mg/dL (ref 70–99)
Potassium: 4.1 mmol/L (ref 3.5–5.2)
Sodium: 137 mmol/L (ref 134–144)
eGFR: 96 mL/min/{1.73_m2} (ref >59)

## 2021-10-09 ENCOUNTER — Ambulatory Visit (HOSPITAL_COMMUNITY)
Admission: RE | Admit: 2021-10-09 | Discharge: 2021-10-09 | Disposition: A | Discharge status: Home / Self Care | Payer: Medicare HMO | Source: Ambulatory Visit | Attending: Internal Medicine | Admitting: Internal Medicine

## 2021-10-09 DIAGNOSIS — I779 Disorder of arteries and arterioles, unspecified: Secondary | ICD-10-CM | POA: Insufficient documentation

## 2021-10-09 MED ORDER — IOHEXOL 350 MG/ML SOLN
100.0000 mL | Freq: Once | INTRAVENOUS | Status: AC | PRN
Start: 2021-10-09 — End: 2021-10-09
  Administered 2021-10-09: 100 mL via INTRAVENOUS

## 2021-12-22 ENCOUNTER — Other Ambulatory Visit: Payer: Self-pay | Admitting: Internal Medicine

## 2022-01-02 ENCOUNTER — Telehealth: Payer: Self-pay | Admitting: Internal Medicine

## 2022-01-02 DIAGNOSIS — I2510 Atherosclerotic heart disease of native coronary artery without angina pectoris: Secondary | ICD-10-CM

## 2022-01-02 DIAGNOSIS — Z0001 Encounter for general adult medical examination with abnormal findings: Secondary | ICD-10-CM

## 2022-01-02 DIAGNOSIS — E782 Mixed hyperlipidemia: Secondary | ICD-10-CM

## 2022-01-02 NOTE — Telephone Encounter (Signed)
  Per MyChart Scheduling message:  Patient is requesting to have her labs done next week before her appointment with Dr Tenny Craw. No orders in Epic at this time. Can orders be placed?

## 2022-01-04 NOTE — Addendum Note (Signed)
Addended by: Lendon Ka on: 01/04/2022 01:35 PM   Modules accepted: Orders

## 2022-01-04 NOTE — Telephone Encounter (Signed)
Pt sent this today per mychart to our scheduling pool:    [name], I have waited long enough;  I have been seeing Dr Tenny Craw for years and know the routine.  I am to see her next Wednesday before an extended vacation and need to maximize the value of that annual checkup by being able to have her react to labs.  With Monday being a holiday, I need to come in today or tomorrow at the latest.  Please get this taken care of.  Thank you, Ruben Sosa

## 2022-01-04 NOTE — Telephone Encounter (Signed)
I sent a reply to the patient in MyChart and placed his lab orders and scheduled his lab appointment.

## 2022-01-04 NOTE — Telephone Encounter (Signed)
Reviewed with Dr. Tenny Craw and updated lab orders as she has requested patient to have:  BMET PSA NMR Lipo profile w Lipo A, Apo B HbA1c

## 2022-01-05 ENCOUNTER — Ambulatory Visit: Payer: Medicare HMO | Attending: Internal Medicine

## 2022-01-05 DIAGNOSIS — Z0001 Encounter for general adult medical examination with abnormal findings: Secondary | ICD-10-CM

## 2022-01-05 DIAGNOSIS — E782 Mixed hyperlipidemia: Secondary | ICD-10-CM

## 2022-01-05 DIAGNOSIS — I2510 Atherosclerotic heart disease of native coronary artery without angina pectoris: Secondary | ICD-10-CM

## 2022-01-06 LAB — NMR, LIPOPROFILE
Cholesterol, Total: 137 mg/dL (ref 100–199)
HDL Particle Number: 32.9 umol/L (ref >=30.5)
HDL-C: 44 mg/dL (ref >39)
LDL Particle Number: 969 nmol/L (ref <1000)
LDL Size: 20.7 nm (ref >20.5)
LDL-C (NIH Calc): 67 mg/dL (ref 0–99)
LP-IR Score: 75 — ABNORMAL HIGH (ref <=45)
Small LDL Particle Number: 423 nmol/L (ref <=527)
Triglycerides: 148 mg/dL (ref 0–149)

## 2022-01-06 LAB — BASIC METABOLIC PANEL
BUN/Creatinine Ratio: 12 (ref 10–24)
BUN: 9 mg/dL (ref 8–27)
CO2: 23 mmol/L (ref 20–29)
Calcium: 9.3 mg/dL (ref 8.6–10.2)
Chloride: 101 mmol/L (ref 96–106)
Creatinine, Ser: 0.75 mg/dL — ABNORMAL LOW (ref 0.76–1.27)
Glucose: 105 mg/dL — ABNORMAL HIGH (ref 70–99)
Potassium: 3.9 mmol/L (ref 3.5–5.2)
Sodium: 137 mmol/L (ref 134–144)
eGFR: 95 mL/min/{1.73_m2} (ref >59)

## 2022-01-06 LAB — PSA: Prostate Specific Ag, Serum: 4.1 ng/mL — ABNORMAL HIGH (ref 0.0–4.0)

## 2022-01-06 LAB — HEMOGLOBIN A1C
Est. average glucose Bld gHb Est-mCnc: 117 mg/dL
Hgb A1c MFr Bld: 5.7 % — ABNORMAL HIGH (ref 4.8–5.6)

## 2022-01-06 LAB — APOLIPOPROTEIN B: Apolipoprotein B: 66 mg/dL (ref <90)

## 2022-01-06 LAB — LIPOPROTEIN A (LPA): Lipoprotein (a): 22.4 nmol/L (ref <75.0)

## 2022-01-08 NOTE — Progress Notes (Unsigned)
Cardiology Office Note   Date:  01/10/2022   ID:  Eragon Hammond, DOB May 31, 1945, MRN [id number]  PCP:  Eartha Inch, MD  Cardiologist:   Dietrich Pates, MD   F/U of HTN   History of Present Illness: Ruben Sosa is a 76 y.o. male with a history of HTN, HL and tremor.  He also has a hx of chest pain in the past   Echo normal   CT coronary angiogram done   Calcium score was 18.9   Mild nonobstructive CAD    Thoracic aorta 42 mm      I saw the pt in 2022   Since seen the pt had a repeat CT that showed ascending aorta was 44 mm ( 1.9 cm/BSA)  Since seen the pt says he is doing good    Breathing is OK   NO CP   No palpitations.     Admits that diet isnt always good    Doesn;t like to cook   Current Meds  Medication Sig   Ascorbic Acid (VITAMIN C) 500 MG CAPS Take 500 mg by mouth daily.    ASPIRIN EC PO Take 81 mg by mouth daily.   atorvastatin (LIPITOR) 20 MG tablet TAKE 1 TABLET BY MOUTH EVERY DAY   bisoprolol-hydrochlorothiazide (ZIAC) 5-6.25 MG tablet Take 1 tablet by mouth daily.   Coenzyme Q10 (CO Q 10) 100 MG CAPS Take 100 mg by mouth daily.   fish oil-omega-3 fatty acids 1000 MG capsule Take 1 g by mouth daily.    Lysine 500 MG CAPS Take by mouth daily.   Magnesium 250 MG TABS Take 250 mg by mouth daily.   Multiple Vitamin (MULTIVITAMIN) capsule Take 2 capsules by mouth daily.    Niacin (VITAMIN B-3 PO) Take by mouth.   Potassium 99 MG TABS Take 1 tablet by mouth daily.   Turmeric (QC TUMERIC COMPLEX PO) Take by mouth.   VITAMIN D PO Take 1 tablet by mouth daily.   vitamin E 400 UNIT capsule Take 400 Units by mouth daily.     Allergies:   Patient has no known allergies.   Past Medical History:  Diagnosis Date   Familial tremor    Hypercholesteremia    Hypertension    Ventral hernia     Past Surgical History:  Procedure Laterality Date   ACHILLES TENDON REPAIR     APPENDECTOMY     COLONOSCOPY W/ POLYPECTOMY     ESOPHAGOGASTRODUODENOSCOPY  08/31/1998   with  biopsies   LARYNGOSCOPY     flexible fiberoptic   MOUTH SURGERY     status post laparoscopic repair of a ventral incisional hernia  1/9/   vastectomy     VENTRAL HERNIA REPAIR  05/15/2002     Social History:  The patient  reports that he has been smoking cigars. He has never used smokeless tobacco. He reports current alcohol use. He reports that he does not use drugs.   Family History:  The patient's family history includes Coronary artery disease in his father.    ROS:  Please see the history of present illness. All other systems are reviewed and  Negative to the above problem except as noted.    PHYSICAL EXAM: BP 132/84  P 57   Wt 230 lb    GEN: Pt appears comfortable, in NAD   HEENT: normal  Neck: no JVD, no bruits  Cardiac: RRR; no murmurs; no LE edema  Respiratory:  clear to auscultation bilaterally, GI:  soft, nontender, nondistended, + BS  No hepatomegaly  MS: no deformity Moving all extremities   Skin: warm and dry, no rash Neuro:  Strength and sensation are intact Psych: euthymic mood, full affect   EKG:  EKG is ordered today.  Sinus bradycardia 56 bpm  Nonspecific ST changes    Lipid Panel    Component Value Date/Time   CHOL 148 01/24/2021 1037   CHOL 234 (H) 11/12/2014 0823   TRIG 134 01/24/2021 1037   TRIG 212 (H) 11/12/2014 0823   HDL 45 01/24/2021 1037   HDL 42 11/12/2014 0823   CHOLHDL 3.3 01/24/2021 1037   CHOLHDL 3.3 11/23/2015 0932   VLDL 38 (H) 11/23/2015 0932   LDLCALC 79 01/24/2021 1037   LDLCALC 150 (H) 11/12/2014 0823      Wt Readings from Last 3 Encounters:  01/10/22 230 lb 6.4 oz (104.5 kg)  01/29/20 210 lb (95.3 kg)  03/06/19 209 lb (94.8 kg)      ASSESSMENT AND PLAN:  1  Hx CAD   Pt with minimal CAD by CT   Remains asymptomatic   Follow   Rx risks  2  Thoracic aorta   PT with recent CT showing ascending aorta measuring 44 mm ( up from 42 on last visit)  The pt is 6'2" and weighs 230   Overall places him in a low risk category    Will follow periodically    3  HL   Recent lipids look good   LDL 67  HDL 44  Trig 148   Watch carbs      4  HTN   BP is adequately controlled  Keep on same meds     5 Hgb A1C   Mildly increased  Discussed diet  Limit carbs  6 PSA  Pt has appt in Urology tomorrow     F/U in 1 year     Current medicines are reviewed at length with the patient today.  The patient does not have concerns regarding medicines.  Signed, Dietrich Pates, MD  01/10/2022 9:27 AM    [facility] [address] [place], [place], [place]  [address] Phone: [phone]; Fax: [phone]

## 2022-01-10 ENCOUNTER — Encounter: Payer: Self-pay | Admitting: Internal Medicine

## 2022-01-10 ENCOUNTER — Ambulatory Visit: Payer: Medicare HMO | Attending: Internal Medicine | Admitting: Internal Medicine

## 2022-01-10 VITALS — BP 132/84 | HR 57 | Ht 74.0 in | Wt 230.4 lb

## 2022-01-10 DIAGNOSIS — E782 Mixed hyperlipidemia: Secondary | ICD-10-CM | POA: Diagnosis not present

## 2022-01-10 MED ORDER — ATORVASTATIN CALCIUM 20 MG PO TABS: 20.0000 mg | ORAL_TABLET | Freq: Every day | ORAL | 3 refills | Status: DC

## 2022-01-10 MED ORDER — BISOPROLOL-HYDROCHLOROTHIAZIDE 5-6.25 MG PO TABS: 1.0000 | ORAL_TABLET | Freq: Every day | ORAL | 3 refills | Status: DC

## 2022-07-06 DIAGNOSIS — C61 Malignant neoplasm of prostate: Secondary | ICD-10-CM

## 2022-07-06 HISTORY — DX: Malignant neoplasm of prostate: C61

## 2022-08-14 ENCOUNTER — Telehealth: Payer: Self-pay | Admitting: Radiation Oncology

## 2022-08-14 NOTE — Telephone Encounter (Signed)
Spoke to pt who asked me to c/b @ 2pm due to being in a meeting. Will c/b to sched CON with Dr. Kathrynn Running.

## 2022-08-16 ENCOUNTER — Encounter: Payer: Self-pay | Admitting: Radiation Oncology

## 2022-08-16 NOTE — Progress Notes (Signed)
GU Location of Tumor / Histology:  Prostate Ca  If Prostate Cancer, Gleason Score is (3 + 4) and PSA is (4.2 on 03/2022)  Biopsies      Past/Anticipated interventions by urology, if any:  To see Dr. Laverle Patter next week.    Past/Anticipated interventions by medical oncology, if any: NA  Weight changes, if any: No  IPSS:  4 SHIM:  No sexual activity since wife passed 2020.  Bowel/Bladder complaints, if any:  No, urine stream rust color at start of the stream following biopsy and his urologist was notified.  Nausea/Vomiting, if any:  No  Pain issues, if any:  0/10  SAFETY ISSUES: Prior radiation?  No Pacemaker/ICD? No Possible current pregnancy? Male Is the patient on methotrexate? No  Current Complaints / other details:

## 2022-08-20 DIAGNOSIS — C61 Malignant neoplasm of prostate: Secondary | ICD-10-CM | POA: Insufficient documentation

## 2022-08-20 NOTE — Progress Notes (Signed)
Radiation Oncology         [phone] ________________________________  Initial Outpatient Consultation  Name: Ruben Sosa MRN: [id number]  Date: 08/21/2022  DOB: Nov 11, 1945  WU:[id number], Kayleen Memos, MD  Heloise Purpura, MD   REFERRING PHYSICIAN: Heloise Purpura, MD  DIAGNOSIS: 77 y.o. gentleman with Stage T1c adenocarcinoma of the prostate with Gleason score of 3+4, and PSA of 4.2.    ICD-10-CM   1. Malignant neoplasm of prostate  C61       HISTORY OF PRESENT ILLNESS: Ruben Sosa is a 77 y.o. male with a diagnosis of prostate cancer. He was noted to have an elevated PSA of 4.2 in 02/2020 by his primary care physician, Dr. Cyndia Bent.  Accordingly, he was referred for evaluation in urology by Dr. Laverle Patter in 02/2020,  digital rectal examination performed at that time showed no discrete nodules or concerning findings. He had a prostate MRI on 03/28/20 which was without any concerning findings. A repeat PSA decreased to 2.85 in 05/2020 and remained stable at 2.72 in 12/2020 but subsequently increased to 4.4 in 09/2021 and remained elevated at 4.2 in 03/2022. Given the persistent elevation and strong family history of prostate cancer in 2 brothers and 2 paternal uncles, the patient proceeded to transrectal ultrasound with 12 biopsies of the prostate on 08/01/22.  The prostate volume measured 62.8 cc.  Out of 12 core biopsies, 6 were positive.  The maximum Gleason score was 3+4, and this was seen in the left base and left base lateral. Additionally, there was Gleason 3+3 in the left mid, right base, right base lateral and right apex lateral.  The patient reviewed the biopsy results with his urologist and he has kindly been referred today for discussion of potential radiation treatment options.   PREVIOUS RADIATION THERAPY: {EXAM; YES/NO:19492::"No"}  PAST MEDICAL HISTORY:  Past Medical History:  Diagnosis Date   Elevated PSA    Familial tremor    Hypercholesteremia    Hypertension    Ventral hernia        PAST SURGICAL HISTORY: Past Surgical History:  Procedure Laterality Date   ACHILLES TENDON REPAIR     APPENDECTOMY     COLONOSCOPY W/ POLYPECTOMY     ESOPHAGOGASTRODUODENOSCOPY  08/31/1998   with biopsies   LARYNGOSCOPY     flexible fiberoptic   MOUTH SURGERY     PROSTATE BIOPSY     status post laparoscopic repair of a ventral incisional hernia  1/9/   vastectomy     VENTRAL HERNIA REPAIR  05/15/2002    FAMILY HISTORY:  Family History  Problem Relation Age of Onset   Coronary artery disease Father    Prostate cancer Brother    Prostate cancer Brother    Prostate cancer Paternal Uncle    Prostate cancer Paternal Uncle     SOCIAL HISTORY:  Social History   Socioeconomic History   Marital status: Widowed    Spouse name: Not on file   Number of children: Not on file   Years of education: Not on file   Highest education level: Not on file  Occupational History   Not on file  Tobacco Use   Smoking status: Some Days    Types: Cigars   Smokeless tobacco: Never  Vaping Use   Vaping Use: Never used  Substance and Sexual Activity   Alcohol use: Yes   Drug use: No   Sexual activity: Not on file  Other Topics Concern   Not on file  Social History Narrative  Not on file   Social Determinants of Health   Financial Resource Strain: Not on file  Food Insecurity: Not on file  Transportation Needs: Not on file  Physical Activity: Not on file  Stress: Not on file  Social Connections: Not on file  Intimate Partner Violence: Not on file    ALLERGIES: Patient has no known allergies.  MEDICATIONS:  Current Outpatient Medications  Medication Sig Dispense Refill   Ascorbic Acid (VITAMIN C) 500 MG CAPS Take 500 mg by mouth daily.      ASPIRIN EC PO Take 81 mg by mouth daily.     atorvastatin (LIPITOR) 20 MG tablet Take 1 tablet (20 mg total) by mouth daily. 90 tablet 3   bisoprolol-hydrochlorothiazide (ZIAC) 5-6.25 MG tablet Take 1 tablet by mouth daily. 90  tablet 3   Coenzyme Q10 (CO Q 10) 100 MG CAPS Take 100 mg by mouth daily.     fish oil-omega-3 fatty acids 1000 MG capsule Take 1 g by mouth daily.      Lysine 500 MG CAPS Take by mouth daily.     Magnesium 250 MG TABS Take 250 mg by mouth daily.     Multiple Vitamin (MULTIVITAMIN) capsule Take 2 capsules by mouth daily.      Niacin (VITAMIN B-3 PO) Take by mouth.     Potassium 99 MG TABS Take 1 tablet by mouth daily.     Turmeric (QC TUMERIC COMPLEX PO) Take by mouth.     VITAMIN D PO Take 1 tablet by mouth daily.     vitamin E 400 UNIT capsule Take 400 Units by mouth daily.     No current facility-administered medications for this visit.    REVIEW OF SYSTEMS:  On review of systems, the patient reports that he is doing well overall. He denies any chest pain, shortness of breath, cough, fevers, chills, night sweats, unintended weight changes. He denies any bowel disturbances, and denies abdominal pain, nausea or vomiting. He denies any new musculoskeletal or joint aches or pains. His IPSS was ***, indicating *** urinary symptoms. His SHIM was ***, indicating he {does not have/has mild/moderate/severe} erectile dysfunction. A complete review of systems is obtained and is otherwise negative.    PHYSICAL EXAM:  Wt Readings from Last 3 Encounters:  01/10/22 230 lb 6.4 oz (104.5 kg)  01/29/20 210 lb (95.3 kg)  03/06/19 209 lb (94.8 kg)   Temp Readings from Last 3 Encounters:  No data found for Temp   BP Readings from Last 3 Encounters:  01/10/22 132/84  01/29/20 132/82  03/06/19 118/64   Pulse Readings from Last 3 Encounters:  01/10/22 (!) 57  01/29/20 (!) 56  03/06/19 (!) 54    /10  In general this is a well appearing *** male in no acute distress. He's alert and oriented x4 and appropriate throughout the examination. Cardiopulmonary assessment is negative for acute distress, and he exhibits normal effort.     KPS = ***  100 - Normal; no complaints; no evidence of disease. 90    - Able to carry on normal activity; minor signs or symptoms of disease. 80   - Normal activity with effort; some signs or symptoms of disease. 20   - Cares for self; unable to carry on normal activity or to do active work. 60   - Requires occasional assistance, but is able to care for most of his personal needs. 50   - Requires considerable assistance and frequent medical care. 40   -  Disabled; requires special care and assistance. 30   - Severely disabled; hospital admission is indicated although death not imminent. 20   - Very sick; hospital admission necessary; active supportive treatment necessary. 10   - Moribund; fatal processes progressing rapidly. 0     - Dead  Karnofsky DA, Abelmann WH, Craver LS and Burchenal [facility] [phone]) The use of the nitrogen mustards in the palliative treatment of carcinoma: with particular reference to bronchogenic carcinoma Cancer 1 634-56  LABORATORY DATA:  Lab Results  Component Value Date   WBC 6.3 01/24/2021   HGB 14.3 01/24/2021   HCT 41.9 01/24/2021   MCV 90 01/24/2021   PLT 210 01/24/2021   Lab Results  Component Value Date   NA 137 01/05/2022   K 3.9 01/05/2022   CL 101 01/05/2022   CO2 23 01/05/2022   Lab Results  Component Value Date   ALT 38 01/14/2018   AST 27 01/24/2021   ALKPHOS 58 01/14/2018   BILITOT 0.9 01/14/2018     RADIOGRAPHY: No results found.    IMPRESSION/PLAN: 1. 78 y.o. gentleman with Stage T1c adenocarcinoma of the prostate with Gleason Score of 3+4, and PSA of 4.2. We discussed the patient's workup and outlined the nature of prostate cancer in this setting. The patient's T stage, Gleason's score, and PSA put him into the favorable intermediate risk group. Accordingly, he is eligible for a variety of potential treatment options including brachytherapy, 5.5-8 weeks of external radiation, or prostatectomy. We discussed the available radiation techniques, and focused on the details and logistics of delivery. We discussed  and outlined the risks, benefits, short and long-term effects associated with radiotherapy and compared and contrasted these with prostatectomy. We discussed the role of SpaceOAR gel in reducing the rectal toxicity associated with radiotherapy. He appears to have a good understanding of his disease and our treatment recommendations which are of curative intent.  He was encouraged to ask questions that were answered to his stated satisfaction.  At the conclusion of our conversation, the patient is interested in moving forward with ***.  We personally spent *** minutes in this encounter including chart review, reviewing radiological studies, meeting face-to-face with the patient, entering orders and completing documentation.    Marguarite Arbour, PA-C    Margaretmary Dys, MD  [facility]  Radiation Oncology Direct Dial: [phone]  Fax: [phone] [email or web address]  Skype  [email or web address]

## 2022-08-21 ENCOUNTER — Other Ambulatory Visit: Payer: Self-pay

## 2022-08-21 ENCOUNTER — Ambulatory Visit
Admission: RE | Admit: 2022-08-21 | Discharge: 2022-08-21 | Disposition: A | Discharge status: Home / Self Care | Payer: Medicare HMO | Source: Ambulatory Visit | Attending: Radiation Oncology | Admitting: Radiation Oncology

## 2022-08-21 ENCOUNTER — Other Ambulatory Visit: Payer: Self-pay | Admitting: Urology

## 2022-08-21 ENCOUNTER — Encounter: Payer: Self-pay | Admitting: Radiation Oncology

## 2022-08-21 VITALS — BP 145/89 | HR 55 | Temp 97.2°F | Resp 18 | Ht 74.0 in | Wt 222.2 lb

## 2022-08-21 DIAGNOSIS — Z7982 Long term (current) use of aspirin: Secondary | ICD-10-CM | POA: Insufficient documentation

## 2022-08-21 DIAGNOSIS — Z8042 Family history of malignant neoplasm of prostate: Secondary | ICD-10-CM | POA: Insufficient documentation

## 2022-08-21 DIAGNOSIS — C61 Malignant neoplasm of prostate: Secondary | ICD-10-CM | POA: Diagnosis not present

## 2022-08-21 DIAGNOSIS — F17210 Nicotine dependence, cigarettes, uncomplicated: Secondary | ICD-10-CM | POA: Insufficient documentation

## 2022-08-21 DIAGNOSIS — I10 Essential (primary) hypertension: Secondary | ICD-10-CM | POA: Diagnosis not present

## 2022-08-21 DIAGNOSIS — K439 Ventral hernia without obstruction or gangrene: Secondary | ICD-10-CM | POA: Insufficient documentation

## 2022-08-21 DIAGNOSIS — Z79899 Other long term (current) drug therapy: Secondary | ICD-10-CM | POA: Diagnosis not present

## 2022-08-21 DIAGNOSIS — E7800 Pure hypercholesterolemia, unspecified: Secondary | ICD-10-CM | POA: Diagnosis not present

## 2022-08-21 HISTORY — DX: Elevated prostate specific antigen (PSA): R97.20

## 2022-08-21 NOTE — Progress Notes (Signed)
Introduced myself to the patient as the prostate nurse navigator.  No barriers to care identified at this time.  He is here to discuss his radiation treatment options, and does have a surgical consult with Dr. Laverle Patter on 4/23.  I gave him my business card and asked him to call me with questions or concerns.  Verbalized understanding.

## 2022-08-21 NOTE — Addendum Note (Signed)
Encounter addended by: Roel Cluck, RN on: 08/21/2022 9:31 AM  Actions taken: Charge Capture section accepted

## 2022-08-22 ENCOUNTER — Telehealth: Payer: Self-pay | Admitting: Family Medicine

## 2022-08-22 NOTE — Telephone Encounter (Signed)
Reached out to patient per IB Message left voicemail.

## 2022-08-28 NOTE — Progress Notes (Signed)
Patient was a consult on 4/16 for his stage T1c adenocarcinoma of the prostate with Gleason score of 3+4, and PSA of 4.2.   Patient has decided to proceed with daily radiation.  MD's notified.    Plan of care in progress.

## 2022-08-29 ENCOUNTER — Encounter (HOSPITAL_BASED_OUTPATIENT_CLINIC_OR_DEPARTMENT_OTHER): Payer: Self-pay | Admitting: Urology

## 2022-08-29 ENCOUNTER — Other Ambulatory Visit: Payer: Self-pay | Admitting: Urology

## 2022-08-29 NOTE — Progress Notes (Signed)
Spoke w/ via phone for pre-op interview--- pt Lab needs dos----  Mirant results------ current EKG in epic/ chart COVID test -----patient states asymptomatic no test needed Arrive at ------- 1030 on 09-03-2022 NPO after MN NO Solid Food.  Clear liquids from MN until--- 0930 Med rec completed Medications to take morning of surgery ----- none Diabetic medication ----- n/a Patient instructed no nail polish to be worn day of surgery Patient instructed to bring photo id and insurance card day of surgery Patient aware to have Driver (ride ) / caregiver    for 24 hours after surgery -- daughter, Nicholaus Bloom Patient Special Instructions ----- will do fleet enema night before surgery Pre-Op special Instructions ----- n/a Patient verbalized understanding of instructions that were given at this phone interview. Patient denies shortness of breath, chest pain, fever, cough at this phone interview.

## 2022-08-31 ENCOUNTER — Telehealth: Payer: Self-pay | Admitting: *Deleted

## 2022-08-31 NOTE — Telephone Encounter (Signed)
CALLED PATIENT TO INFORM OF FID. MARKER AND SPACE OAR PLACEMENT ON 09-03-22  AND HIS SIM ON 09-05-22- ARRIVAL TIME- 7:45 AM @ [facility], LVM FOR A RETURN CALL

## 2022-09-03 ENCOUNTER — Ambulatory Visit (HOSPITAL_BASED_OUTPATIENT_CLINIC_OR_DEPARTMENT_OTHER): Payer: Medicare HMO | Admitting: Certified Registered"

## 2022-09-03 ENCOUNTER — Encounter (HOSPITAL_BASED_OUTPATIENT_CLINIC_OR_DEPARTMENT_OTHER): Payer: Self-pay | Admitting: Urology

## 2022-09-03 ENCOUNTER — Encounter (HOSPITAL_BASED_OUTPATIENT_CLINIC_OR_DEPARTMENT_OTHER)
Admission: RE | Disposition: A | Discharge status: Home / Self Care | Payer: Self-pay | Source: Ambulatory Visit | Attending: Urology

## 2022-09-03 ENCOUNTER — Other Ambulatory Visit: Payer: Self-pay

## 2022-09-03 ENCOUNTER — Ambulatory Visit (HOSPITAL_BASED_OUTPATIENT_CLINIC_OR_DEPARTMENT_OTHER)
Admission: RE | Admit: 2022-09-03 | Discharge: 2022-09-03 | Disposition: A | Discharge status: Home / Self Care | Payer: Medicare HMO | Source: Ambulatory Visit | Attending: Urology | Admitting: Urology

## 2022-09-03 DIAGNOSIS — C61 Malignant neoplasm of prostate: Secondary | ICD-10-CM

## 2022-09-03 DIAGNOSIS — I2510 Atherosclerotic heart disease of native coronary artery without angina pectoris: Secondary | ICD-10-CM

## 2022-09-03 DIAGNOSIS — K449 Diaphragmatic hernia without obstruction or gangrene: Secondary | ICD-10-CM | POA: Diagnosis not present

## 2022-09-03 DIAGNOSIS — Z87891 Personal history of nicotine dependence: Secondary | ICD-10-CM | POA: Diagnosis not present

## 2022-09-03 DIAGNOSIS — I10 Essential (primary) hypertension: Secondary | ICD-10-CM

## 2022-09-03 DIAGNOSIS — I739 Peripheral vascular disease, unspecified: Secondary | ICD-10-CM | POA: Insufficient documentation

## 2022-09-03 DIAGNOSIS — Z01818 Encounter for other preprocedural examination: Secondary | ICD-10-CM

## 2022-09-03 HISTORY — DX: Benign prostatic hyperplasia with lower urinary tract symptoms: N40.1

## 2022-09-03 HISTORY — DX: Atherosclerotic heart disease of native coronary artery without angina pectoris: I25.10

## 2022-09-03 HISTORY — DX: Personal history of malignant melanoma of skin: Z85.820

## 2022-09-03 HISTORY — DX: Personal history of colonic polyps: Z86.010

## 2022-09-03 HISTORY — PX: SPACE OAR INSTILLATION: SHX6769

## 2022-09-03 HISTORY — DX: Other specified postprocedural states: Z98.890

## 2022-09-03 HISTORY — DX: Pneumonia, unspecified organism: J18.9

## 2022-09-03 HISTORY — DX: Other specified postprocedural states: Z85.9

## 2022-09-03 HISTORY — DX: Diaphragmatic hernia without obstruction or gangrene: K44.9

## 2022-09-03 HISTORY — DX: Psoriasis, unspecified: L40.9

## 2022-09-03 HISTORY — PX: GOLD SEED IMPLANT: SHX6343

## 2022-09-03 HISTORY — DX: Mixed hyperlipidemia: E78.2

## 2022-09-03 HISTORY — DX: Abdominal aortic aneurysm, without rupture, unspecified: I71.40

## 2022-09-03 HISTORY — DX: Diverticulosis of large intestine without perforation or abscess without bleeding: K57.30

## 2022-09-03 HISTORY — DX: Essential tremor: G25.0

## 2022-09-03 HISTORY — DX: Personal history of adenomatous and serrated colon polyps: Z86.0101

## 2022-09-03 LAB — POCT I-STAT, CHEM 8
BUN: 15 mg/dL (ref 8–23)
Calcium, Ion: 1.17 mmol/L (ref 1.15–1.40)
Chloride: 102 mmol/L (ref 98–111)
Creatinine, Ser: 0.7 mg/dL (ref 0.61–1.24)
Glucose, Bld: 94 mg/dL (ref 70–99)
HCT: 45 % (ref 39.0–52.0)
Hemoglobin: 15.3 g/dL (ref 13.0–17.0)
Potassium: 5.2 mmol/L — ABNORMAL HIGH (ref 3.5–5.1)
Sodium: 139 mmol/L (ref 135–145)
TCO2: 29 mmol/L (ref 22–32)

## 2022-09-03 SURGERY — INSERTION, GOLD SEEDS
Anesthesia: Monitor Anesthesia Care | Site: Prostate

## 2022-09-03 MED ORDER — ACETAMINOPHEN 500 MG PO TABS
1000.0000 mg | ORAL_TABLET | Freq: Once | ORAL | Status: AC
  Administered 2022-09-03: 1000 mg via ORAL

## 2022-09-03 MED ORDER — PROPOFOL 10 MG/ML IV BOLUS
INTRAVENOUS | Status: DC | PRN
  Administered 2022-09-03: 40 mg via INTRAVENOUS

## 2022-09-03 MED ORDER — CEFAZOLIN SODIUM-DEXTROSE 2-4 GM/100ML-% IV SOLN
INTRAVENOUS | Status: AC
  Filled 2022-09-03: qty 100

## 2022-09-03 MED ORDER — CEFAZOLIN SODIUM-DEXTROSE 2-4 GM/100ML-% IV SOLN
2.0000 g | INTRAVENOUS | Status: AC
  Administered 2022-09-03: 2 g via INTRAVENOUS

## 2022-09-03 MED ORDER — PROPOFOL 500 MG/50ML IV EMUL
INTRAVENOUS | Status: DC | PRN
  Administered 2022-09-03: 200 ug/kg/min via INTRAVENOUS

## 2022-09-03 MED ORDER — PROPOFOL 500 MG/50ML IV EMUL
INTRAVENOUS | Status: AC
  Filled 2022-09-03: qty 50

## 2022-09-03 MED ORDER — ACETAMINOPHEN 500 MG PO TABS: 1000.0000 mg | ORAL_TABLET | Freq: Once | ORAL | Status: DC

## 2022-09-03 MED ORDER — LACTATED RINGERS IV SOLN: INTRAVENOUS | Status: DC

## 2022-09-03 MED ORDER — BUPIVACAINE HCL 0.25 % IJ SOLN
INTRAMUSCULAR | Status: DC | PRN
  Administered 2022-09-03: 10 mL

## 2022-09-03 MED ORDER — FENTANYL CITRATE (PF) 100 MCG/2ML IJ SOLN
INTRAMUSCULAR | Status: AC
  Filled 2022-09-03: qty 2

## 2022-09-03 MED ORDER — SODIUM CHLORIDE (PF) 0.9 % IJ SOLN
INTRAMUSCULAR | Status: DC | PRN
  Administered 2022-09-03: 10 mL

## 2022-09-03 MED ORDER — ACETAMINOPHEN 500 MG PO TABS
ORAL_TABLET | ORAL | Status: AC
  Filled 2022-09-03: qty 2

## 2022-09-03 MED ORDER — FENTANYL CITRATE (PF) 100 MCG/2ML IJ SOLN
INTRAMUSCULAR | Status: DC | PRN
  Administered 2022-09-03: 75 ug via INTRAVENOUS

## 2022-09-03 SURGICAL SUPPLY — 27 items
BLADE CLIPPER SENSICLIP SURGIC (BLADE) ×2 IMPLANT
CNTNR URN SCR LID CUP LEK RST (MISCELLANEOUS) ×2 IMPLANT
CONT SPEC 4OZ STRL OR WHT (MISCELLANEOUS) ×2
COVER BACK TABLE 60X90IN (DRAPES) ×2 IMPLANT
DRAPE C-ARM 35X43 STRL (DRAPES) ×2 IMPLANT
DRSG TEGADERM 4X4.75 (GAUZE/BANDAGES/DRESSINGS) ×2 IMPLANT
DRSG TEGADERM 8X12 (GAUZE/BANDAGES/DRESSINGS) ×2 IMPLANT
GAUZE SPONGE 4X4 12PLY STRL (GAUZE/BANDAGES/DRESSINGS) ×2 IMPLANT
GAUZE SPONGE 4X4 12PLY STRL LF (GAUZE/BANDAGES/DRESSINGS) IMPLANT
GLOVE BIO SURGEON STRL SZ7.5 (GLOVE) ×2 IMPLANT
GLOVE SURG ORTHO 8.5 STRL (GLOVE) ×2 IMPLANT
IMPL SPACEOAR VUE SYSTEM (Spacer) ×2 IMPLANT
IMPLANT SPACEOAR VUE SYSTEM (Spacer) ×2 IMPLANT
KIT TURNOVER CYSTO (KITS) ×2 IMPLANT
MARKER GOLD PRELOAD 1.2X3 (Urological Implant) ×2 IMPLANT
MARKER SKIN DUAL TIP RULER LAB (MISCELLANEOUS) ×2 IMPLANT
NDL SPNL 22GX3.5 QUINCKE BK (NEEDLE) ×2 IMPLANT
NEEDLE SPNL 22GX3.5 QUINCKE BK (NEEDLE) ×2 IMPLANT
SEED GOLD PRELOAD 1.2X3 (Urological Implant) ×6 IMPLANT
SHEATH ULTRASOUND LF (SHEATH) IMPLANT
SHEATH ULTRASOUND LTX NONSTRL (SHEATH) IMPLANT
SLEEVE SCD COMPRESS KNEE MED (STOCKING) ×2 IMPLANT
SURGILUBE 2OZ TUBE FLIPTOP (MISCELLANEOUS) ×2 IMPLANT
SYR 10ML LL (SYRINGE) ×2 IMPLANT
SYR CONTROL 10ML LL (SYRINGE) ×2 IMPLANT
TOWEL OR 17X24 6PK STRL BLUE (TOWEL DISPOSABLE) ×2 IMPLANT
UNDERPAD 30X36 HEAVY ABSORB (UNDERPADS AND DIAPERS) ×2 IMPLANT

## 2022-09-03 NOTE — Discharge Instructions (Addendum)
Call if fever > 101 or inability to urinate.  Take Tylenol as needed for discomfort.  You may resume normal activities tomorrow.  Radioactive Seed Implant Home Care Instructions   Activity:    Rest for the remainder of the day.  Do not drive or operate equipment today.  You may resume normal  activities in a few days as instructed by your physician, without risk of harmful radiation exposure to those around you, provided you follow the time and distance precautions on the Radiation Oncology Instruction Sheet.   Meals: Drink plenty of lipuids and eat light foods, such as gelatin or soup this evening .  You may return to normal meal plan tomorrow.  Return To Work: You may return to work as instructed by Designer, multimedia.  Special Instruction:   If any seeds are found, use tweezers to pick up seeds and place in a glass container of any kind and bring to your physician's office.  Call your physician if any of these symptoms occur:  Persistent or heavy bleeding Urine stream diminishes or stops completely after catheter is removed Fever equal to or greater than 101 degrees F Cloudy urine with a strong foul odor Severe pain  You may feel some burning pain and/or hesitancy when you urinate after the catheter is removed.  These symptoms may increase over the next few weeks, but should diminish within forur to six weeks.  Applying moist heat to the lower abdomen or a hot tub bath may help relieve the pain.  If the discomfort becomes severe, please call your physician for additional medications.   You were given Tylenol prior to your surgical procedure today, please do not take any more Tylenol until after 5pm today

## 2022-09-03 NOTE — Transfer of Care (Signed)
Immediate Anesthesia Transfer of Care Note  Patient: Ruben Sosa  Procedure(s) Performed: Procedure(s) (LRB): GOLD SEED IMPLANT (N/A) SPACE OAR INSTILLATION (N/A)  Patient Location: PACU  Anesthesia Type: MAC  Level of Consciousness: awake, alert , oriented and patient cooperative  Airway & Oxygen Therapy: Patient Spontanous Breathing and Patient connected to face mask oxygen  Post-op Assessment: Report given to PACU RN and Post -op Vital signs reviewed and stable  Post vital signs: Reviewed and stable  Complications: No apparent anesthesia complications Last Vitals:  Vitals Value Taken Time  BP    Temp    Pulse    Resp    SpO2      Last Pain:  Vitals:   09/03/22 1054  TempSrc: Oral  PainSc: 0-No pain         Complications: No notable events documented.

## 2022-09-03 NOTE — H&P (Signed)
Office Visit Report     08/28/2022   --------------------------------------------------------------------------------   Ruben Sosa  MRN: [id number]  DOB: 09/16/1945, 77 year old Male  SSN: [id number]   PRIMARY CARE:     REFERRING:  Sherene Sires, M  PROVIDER:  Heloise Purpura, M.D.  LOCATION:  [facility] [phone]     --------------------------------------------------------------------------------   CC/HPI: CC: Prostate Cancer   PCP:   Ruben Sosa is a 77 year old gentleman who has been followed for an elevated PSA. He was initially seen for a PSA of 4.4 and a prostate MRI in 2021 was unremarkable. His PSA then decreased but increased again up to 4.2 prompting a TRUS biopsy of the prostate on 08/01/22 that confirmed Gleason 3+4=7 adenocarcinoma of the prostate with 6 out of 12 biopsy cores positive.   Family history: Brother with metastatic disease   Imaging studies: None.   PMH: He has a history of hyperlipidemia.  PSH: Appendectomy, laparoscopic hernia repair.   TNM stage: cT1c Nx Mx  PSA: 4.2  Gleason score: 3+4=7 (GG 2)  Biopsy (08/01/22): 6/12 cores positive  Left: L mid (30%, 3+3=6), L lateral base (50%, 3+4=7, PNI), L base (60%, 3+4=7, PNI)  Right: R lateral apex (10%, 3+3=6), R base (5%, 3+3=6), R lateral base (10%, 3+3=6, PNI)  Prostate volume: 62.8 cc  PSAD: 0.07   Nomogram  OC disease: 53%  EPE: 46%  SVI: 6%  LNI: 5%  PFS (5 year, 10 year): 83%, 73%   Urinary function: IPSS is 1.  Erectile function: SHIM score is N/A. He has been sexually active since his wife passed away a few years from cancer.     ALLERGIES: No Allergies    MEDICATIONS: Levofloxacin 750 mg tablet Please take one tablet the morning of your biopsy.  Lipitor 1 PO Daily  Ziac     GU PSH: Prostate Needle Biopsy - 08/01/2022 Vasectomy     NON-GU PSH: Appendectomy (laparoscopic) Hernia Repair Inguinal hernia repair (laparoscopic) Repair Achilles  Tendon Surgical Pathology, Gross And Microscopic Examination For Prostate Needle - 08/01/2022     GU PMH: Elevated PSA - 08/01/2022, - 03/21/2022, - 12/14/2020, - 2022, - 2021      PMH Notes:   1) Elevated PSA: He presented to me in October 2021. He has a strong family history of prostate cancer having had 2 brothers in the their late 35s and 2 paternal uncles with prostate cancer. His baseline PSA had been 1.8-2.1 but increased to 4.2 at the time of presentation and was 3.9 when rechecked.   Nov 2021: Prostate MRI - No suspicious lesions, Vol 56 cc   NON-GU PMH: No Non-GU PMH    FAMILY HISTORY: Heart Disease - Runs in Family Hypertension - Runs in Family Prostate Cancer - Runs in Family   SOCIAL HISTORY: Marital Status: Unknown Preferred Language: English; Ethnicity: Not Hispanic Or Latino; Race: White Current Smoking Status: Patient does not smoke anymore.   Tobacco Use Assessment Completed: Used Tobacco in last 30 days? Does drink.  Drinks 4+ caffeinated drinks per day.    REVIEW OF SYSTEMS:    GU Review Male:   Patient denies frequent urination, hard to postpone urination, burning/ pain with urination, get up at night to urinate, leakage of urine, stream starts and stops, trouble starting your streams, and have to strain to urinate .  Gastrointestinal (Upper):   Patient denies nausea and vomiting.  Gastrointestinal (Lower):   Patient denies diarrhea and  constipation.  Constitutional:   Patient denies fever, night sweats, weight loss, and fatigue.  Skin:   Patient denies skin rash/ lesion and itching.  Eyes:   Patient denies blurred vision and double vision.  Ears/ Nose/ Throat:   Patient denies sore throat and sinus problems.  Hematologic/Lymphatic:   Patient denies swollen glands and easy bruising.  Cardiovascular:   Patient denies leg swelling and chest pains.  Respiratory:   Patient denies cough and shortness of breath.  Endocrine:   Patient denies excessive thirst.   Musculoskeletal:   Patient denies back pain and joint pain.  Neurological:   Patient denies headaches and dizziness.  Psychologic:   Patient denies depression and anxiety.   VITAL SIGNS:      08/28/2022 08:05 AM  Weight 217 lb / 98.43 kg  Height 72 in / 182.88 cm  BP 127/78 mmHg  Pulse 0 /min  Temperature 97.3 F / 36.2 C  BMI 29.4 kg/m   MULTI-SYSTEM PHYSICAL EXAMINATION:    Constitutional: Well-nourished. No physical deformities. Normally developed. Good grooming.     Complexity of Data:  Lab Test Review:   PSA  Records Review:   Previous Patient Records   03/14/22 01/11/22 09/21/21 12/07/20 05/16/20  PSA  Total PSA 4.20 ng/mL 3.42 ng/mL 4.4 ng/ml 2.72 ng/mL 2.85 ng/mL  Free PSA 0.66 ng/mL      % Free PSA 16 % PSA        PROCEDURES:          Urinalysis w/Scope Dipstick Dipstick Cont'd Micro  Color: Yellow Bilirubin: Neg mg/dL WBC/hpf: 0 - 5/hpf  Appearance: Clear Ketones: Neg mg/dL RBC/hpf: 3 - 82/NFA  Specific Gravity: 1.025 Blood: 1+ ery/uL Bacteria: NS (Not Seen)  pH: 6.0 Protein: Neg mg/dL Cystals: NS (Not Seen)  Glucose: Neg mg/dL Urobilinogen: 0.2 mg/dL Casts: NS (Not Seen)    Nitrites: Neg Trichomonas: Not Present    Leukocyte Esterase: Neg leu/uL Mucous: Present      Epithelial Cells: 0 - 5/hpf      Yeast: NS (Not Seen)      Sperm: Not Present    ASSESSMENT:      ICD-10 Details  1 GU:   Prostate Cancer - C61    PLAN:           Schedule Return Visit/Planned Activity: Other See Visit Notes             Note: Will call with final decision as to how he would like to proceed.          Document Letter(s):  Created for Patient: Clinical Summary         Notes:   1. Favorable intermediate risk prostate cancer: I had a detailed discussion with Ruben Sosa today regarding his prostate cancer diagnosis. He has seen Dr. Kathrynn Running and is relatively well-informed both about his diagnosis and his radiotherapy options. The patient was counseled about the natural  history of prostate cancer and the standard treatment options that are available for prostate cancer. It was explained to him how his age and life expectancy, clinical stage, Gleason score/prognostic grade group, and PSA (and PSA density) affect his prognosis, the decision to proceed with additional staging studies, as well as how that information influences recommended treatment strategies. We discussed the roles for active surveillance, radiation therapy, surgical therapy, androgen deprivation, as well as ablative therapy and other investigational options for the treatment of prostate cancer as appropriate to his individual cancer situation. We discussed the risks and  benefits of these options with regard to their impact on cancer control and also in terms of potential adverse events, complications, and impact on quality of life particularly related to urinary and sexual function. The patient was encouraged to ask questions throughout the discussion today and all questions were answered to his stated satisfaction. In addition, the patient was provided with and/or directed to appropriate resources and literature for further education about prostate cancer and treatment options.   At this point, he is heavy leading leaning toward treatment of curative intent although we did have a very balanced discussion about active surveillance management as well. He is inclined to likely proceed with external beam radiation therapy under the care of Dr. Kathrynn Running. He will notify us of his final decision as well as whether he would like to proceed with treatment in the near future before his trip later in the summer or if he would like to delay treatment until after that trip.   CC: Dr. Margaretmary Dys    E & M CODES: We spent 64 minutes dedicated to evaluation and management time, including face to face interaction, discussions on coordination of care, documentation, result review, and discussion with others as applicable.      * Signed by Heloise Purpura, M.D. on 08/28/22 at 5:50 PM (EDT)*

## 2022-09-03 NOTE — Anesthesia Procedure Notes (Signed)
Procedure Name: MAC Date/Time: 09/03/2022 12:00 PM  Performed by: Francie Massing, CRNAOxygen Delivery Method: Simple face mask

## 2022-09-03 NOTE — Anesthesia Preprocedure Evaluation (Signed)
Anesthesia Evaluation  Patient identified by MRN, date of birth, ID band Patient awake    Reviewed: Allergy & Precautions, NPO status , Patient's Chart, lab work & pertinent test results  Airway Mallampati: II  TM Distance: >3 FB Neck ROM: Full    Dental no notable dental hx.    Pulmonary neg pulmonary ROS, pneumonia, former smoker   Pulmonary exam normal        Cardiovascular hypertension, Pt. on medications and Pt. on home beta blockers + CAD and + Peripheral Vascular Disease   Rhythm:Regular Rate:Normal     Neuro/Psych negative neurological ROS  negative psych ROS   GI/Hepatic Neg liver ROS, hiatal hernia,,,  Endo/Other  negative endocrine ROS    Renal/GU negative Renal ROS Bladder dysfunction      Musculoskeletal negative musculoskeletal ROS (+)    Abdominal Normal abdominal exam  (+)   Peds  Hematology negative hematology ROS (+)   Anesthesia Other Findings   Reproductive/Obstetrics                             Anesthesia Physical Anesthesia Plan  ASA: 3  Anesthesia Plan: MAC   Post-op Pain Management:    Induction: Intravenous  PONV Risk Score and Plan: 1 and Ondansetron, Dexamethasone, Propofol infusion and Treatment may vary due to age or medical condition  Airway Management Planned: Simple Face Mask and Nasal Cannula  Additional Equipment: None  Intra-op Plan:   Post-operative Plan:   Informed Consent: I have reviewed the patients History and Physical, chart, labs and discussed the procedure including the risks, benefits and alternatives for the proposed anesthesia with the patient or authorized representative who has indicated his/her understanding and acceptance.     Dental advisory given  Plan Discussed with: CRNA  Anesthesia Plan Comments:        Anesthesia Quick Evaluation

## 2022-09-03 NOTE — Op Note (Signed)
Preoperative diagnosis: Prostate cancer   Postoperative diagnosis: Prostate cancer   Procedure:  1) Fiducial marker placement into the prostate 2) Insertion of SpaceOAR hydrogel    Surgeon: Anjani Feuerborn S. Dakoda Bassette, Jr. M.D.   Anesthesia: IV sedation, Local anesthesia   EBL: Minimal   Complications: None   Indication: The potential risks, complications, alternative options, and expected recovery course associated with the above procedure(s) have been discussed in detail with the patient and he has provided informed consent to proceed.   Description of procedure: The patient was administered preoperative antibiotics, placed in the dorsal lithotomy position, and prepped and draped in the usual sterile fashion. A preoperative time out was performed.  Next, transrectal ultrasonography was utilized to visualize the prostate. 10 cc of 0.25% bupivacaine was then used to infiltrate the subcuateous tissue of the perineum and an additional 10 cc was injected into the lateral apical tissue surrounding the prostate for a periprostatic nerve block.  Three gold fiducial markers were then placed into the prostate via transperineal needles under ultrasound guidance at the right apex, right base, and left mid gland under direct ultrasound guidance.  A site in the midline was then selected on the perineum for placement of an 18 g needle with saline.  The needle was advanced above the rectum and below Denonvillier's fascia to the mid gland and confirmed to be in the midline on transverse imaging.  One cc of saline was injected confirming appropriate expansion of this space.  A total of 5-10 cc of saline was then injected to open the space further bilaterally.  The saline syringe was then removed and the SpaceOAR hydrogel was injected with good distribution bilaterally. He tolerated the procedure well and without complications. He was able to be awakened and transferred to the PACU in stable condition.  

## 2022-09-03 NOTE — Interval H&P Note (Signed)
History and Physical Interval Note:  09/03/2022 10:24 AM  Ruben Sosa  has presented today for surgery, with the diagnosis of PROSTATE CANCER.  The various methods of treatment have been discussed with the patient and family. After consideration of risks, benefits and other options for treatment, the patient has consented to  Procedure(s): GOLD SEED IMPLANT (N/A) SPACE OAR INSTILLATION (N/A) as a surgical intervention.  The patient's history has been reviewed, patient examined, no change in status, stable for surgery.  I have reviewed the patient's chart and labs.  Questions were answered to the patient's satisfaction.     [employer]

## 2022-09-03 NOTE — Anesthesia Postprocedure Evaluation (Signed)
Anesthesia Post Note  Patient: Ruben Sosa  Procedure(s) Performed: GOLD SEED IMPLANT (Prostate) SPACE OAR INSTILLATION (Perineum)     Patient location during evaluation: PACU Anesthesia Type: MAC Level of consciousness: awake and alert Pain management: pain level controlled Vital Signs Assessment: post-procedure vital signs reviewed and stable Respiratory status: spontaneous breathing, nonlabored ventilation, respiratory function stable and patient connected to nasal cannula oxygen Cardiovascular status: stable and blood pressure returned to baseline Postop Assessment: no apparent nausea or vomiting Anesthetic complications: no   No notable events documented.  Last Vitals:  Vitals:   09/03/22 1245 09/03/22 1333  BP:  (!) 151/79  Pulse:  60  Resp:  18  Temp: 36.7 C 36.6 C  SpO2:  100%    Last Pain:  Vitals:   09/03/22 1333  TempSrc: Oral  PainSc: 0-No pain                 Earl Lites P Alannis Hsia

## 2022-09-04 ENCOUNTER — Telehealth: Payer: Self-pay | Admitting: *Deleted

## 2022-09-04 ENCOUNTER — Encounter (HOSPITAL_BASED_OUTPATIENT_CLINIC_OR_DEPARTMENT_OTHER): Payer: Self-pay | Admitting: Urology

## 2022-09-04 NOTE — Telephone Encounter (Signed)
CALLED PATIENT TO REMIND OF SIM APPT. FOR 09-05-22- ARRIVAL TIME- 7:45 AM @ [facility], INFORMED PATIENT TO ARRIVE WITH A FULL BLADDER

## 2022-09-04 NOTE — Telephone Encounter (Signed)
CALLED PATIENT TO ASK ABOUT COMING TO SIGN CONSENT TODAY @ 3 PM, LVM FOR A RETURN CALL

## 2022-09-04 NOTE — Progress Notes (Signed)
  Radiation Oncology         [phone]) [phone] ________________________________  Name: Ruben Sosa MRN: [id number]  Date: 09/05/2022  DOB: 01/04/1946  SIMULATION AND TREATMENT PLANNING NOTE    ICD-10-CM   1. Malignant neoplasm of prostate (HCC)  C61       DIAGNOSIS:  77 y.o. gentleman with Stage T1c adenocarcinoma of the prostate with Gleason score of 3+4, and PSA of 4.2.  NARRATIVE:  The patient was brought to the CT Simulation planning suite.  Identity was confirmed.  All relevant records and images related to the planned course of therapy were reviewed.  The patient freely provided informed written consent to proceed with treatment after reviewing the details related to the planned course of therapy. The consent form was witnessed and verified by the simulation staff.  Then, the patient was set-up in a stable reproducible supine position for radiation therapy.  A vacuum lock pillow device was custom fabricated to position his legs in a reproducible immobilized position.  Then, supervised the performance of a urethrogram under sterile conditions to identify the prostatic apex.  CT images were obtained.  Surface markings were placed.  The CT images were loaded into the planning software.  Then the prostate target and avoidance structures including the rectum, bladder, bowel and hips were contoured.  Treatment planning then occurred.  The radiation prescription was entered and confirmed.  A total of one complex treatment devices was fabricated. I have requested : Intensity Modulated Radiotherapy (IMRT) is medically necessary for this case for the following reason:  Rectal sparing.  I have requested daily cone beam CT volumetric image gudiance to track gold fiducial posiitoning along with bladder and rectal filling, this is medically necessary to assure accurate positioning of high dose radiation.  PLAN:  The patient will receive 70 Gy in 28 fractions.  ________________________________  Artist Pais  Kathrynn Running, M.D.

## 2022-09-05 ENCOUNTER — Ambulatory Visit
Admission: RE | Admit: 2022-09-05 | Discharge: 2022-09-05 | Disposition: A | Discharge status: Home / Self Care | Payer: Medicare HMO | Source: Ambulatory Visit | Attending: Radiation Oncology | Admitting: Radiation Oncology

## 2022-09-05 DIAGNOSIS — Z510 Encounter for antineoplastic radiation therapy: Secondary | ICD-10-CM | POA: Insufficient documentation

## 2022-09-05 DIAGNOSIS — C61 Malignant neoplasm of prostate: Secondary | ICD-10-CM

## 2022-09-13 DIAGNOSIS — Z510 Encounter for antineoplastic radiation therapy: Secondary | ICD-10-CM | POA: Diagnosis not present

## 2022-09-19 ENCOUNTER — Other Ambulatory Visit: Payer: Self-pay

## 2022-09-19 ENCOUNTER — Ambulatory Visit
Admission: RE | Admit: 2022-09-19 | Discharge: 2022-09-19 | Disposition: A | Discharge status: Home / Self Care | Payer: Medicare HMO | Source: Ambulatory Visit | Attending: Radiation Oncology | Admitting: Radiation Oncology

## 2022-09-19 DIAGNOSIS — Z510 Encounter for antineoplastic radiation therapy: Secondary | ICD-10-CM | POA: Diagnosis not present

## 2022-09-19 LAB — RAD ONC ARIA SESSION SUMMARY
Course Elapsed Days: 0
Plan Fractions Treated to Date: 1
Plan Prescribed Dose Per Fraction: 2.5 Gy
Plan Total Fractions Prescribed: 28
Plan Total Prescribed Dose: 70 Gy
Reference Point Dosage Given to Date: 2.5 Gy
Reference Point Session Dosage Given: 2.5 Gy
Session Number: 1

## 2022-09-20 ENCOUNTER — Other Ambulatory Visit: Payer: Self-pay

## 2022-09-20 ENCOUNTER — Ambulatory Visit
Admission: RE | Admit: 2022-09-20 | Discharge: 2022-09-20 | Disposition: A | Discharge status: Home / Self Care | Payer: Medicare HMO | Source: Ambulatory Visit | Attending: Radiation Oncology | Admitting: Radiation Oncology

## 2022-09-20 DIAGNOSIS — Z510 Encounter for antineoplastic radiation therapy: Secondary | ICD-10-CM | POA: Diagnosis not present

## 2022-09-20 LAB — RAD ONC ARIA SESSION SUMMARY
Course Elapsed Days: 1
Plan Fractions Treated to Date: 2
Plan Prescribed Dose Per Fraction: 2.5 Gy
Plan Total Fractions Prescribed: 28
Plan Total Prescribed Dose: 70 Gy
Reference Point Dosage Given to Date: 5 Gy
Reference Point Session Dosage Given: 2.5 Gy
Session Number: 2

## 2022-09-21 ENCOUNTER — Other Ambulatory Visit: Payer: Self-pay

## 2022-09-21 ENCOUNTER — Ambulatory Visit
Admission: RE | Admit: 2022-09-21 | Discharge: 2022-09-21 | Disposition: A | Discharge status: Home / Self Care | Payer: Medicare HMO | Source: Ambulatory Visit | Attending: Radiation Oncology | Admitting: Radiation Oncology

## 2022-09-21 DIAGNOSIS — C61 Malignant neoplasm of prostate: Secondary | ICD-10-CM

## 2022-09-21 DIAGNOSIS — Z510 Encounter for antineoplastic radiation therapy: Secondary | ICD-10-CM | POA: Diagnosis not present

## 2022-09-21 LAB — RAD ONC ARIA SESSION SUMMARY
Course Elapsed Days: 2
Plan Fractions Treated to Date: 3
Plan Prescribed Dose Per Fraction: 2.5 Gy
Plan Total Fractions Prescribed: 28
Plan Total Prescribed Dose: 70 Gy
Reference Point Dosage Given to Date: 7.5 Gy
Reference Point Session Dosage Given: 2.5 Gy
Session Number: 3

## 2022-09-21 MED ORDER — SONAFINE EX EMUL
1.0000 | Freq: Two times a day (BID) | CUTANEOUS | Status: DC
  Administered 2022-09-21: 1 via TOPICAL

## 2022-09-21 NOTE — Progress Notes (Signed)
Pt here for patient teaching. Pt given Radiation and You booklet, skin care instructions, and Sonafine. Reviewed areas of pertinence such as diarrhea, fatigue, hair loss, nausea and vomiting, sexual and fertility changes, skin changes, urinary and bladder changes, headache, and taste changes. Pt able to give teach back of to pat skin, use unscented/gentle soap, use baby wipes, have Imodium on hand, drink plenty of water, and sitz bath,apply Sonafine bid, avoid applying anything to skin within 4 hours of treatment, and to use an electric razor if they must shave. Pt verbalizes understanding of information given and will contact nursing with any questions or concerns.    [email or web address]/whattoexpect/index  This concludes the interview.   [name], LPN

## 2022-09-24 ENCOUNTER — Ambulatory Visit: Payer: Medicare HMO

## 2022-09-25 ENCOUNTER — Ambulatory Visit
Admission: RE | Admit: 2022-09-25 | Discharge: 2022-09-25 | Disposition: A | Discharge status: Home / Self Care | Payer: Medicare HMO | Source: Ambulatory Visit | Attending: Radiation Oncology | Admitting: Radiation Oncology

## 2022-09-25 ENCOUNTER — Other Ambulatory Visit: Payer: Self-pay

## 2022-09-25 DIAGNOSIS — Z510 Encounter for antineoplastic radiation therapy: Secondary | ICD-10-CM | POA: Diagnosis not present

## 2022-09-25 LAB — RAD ONC ARIA SESSION SUMMARY
Course Elapsed Days: 6
Plan Fractions Treated to Date: 4
Plan Prescribed Dose Per Fraction: 2.5 Gy
Plan Total Fractions Prescribed: 28
Plan Total Prescribed Dose: 70 Gy
Reference Point Dosage Given to Date: 10 Gy
Reference Point Session Dosage Given: 2.5 Gy
Session Number: 4

## 2022-09-26 ENCOUNTER — Other Ambulatory Visit: Payer: Self-pay

## 2022-09-26 ENCOUNTER — Ambulatory Visit
Admission: RE | Admit: 2022-09-26 | Discharge: 2022-09-26 | Disposition: A | Discharge status: Home / Self Care | Payer: Medicare HMO | Source: Ambulatory Visit | Attending: Radiation Oncology | Admitting: Radiation Oncology

## 2022-09-26 DIAGNOSIS — Z510 Encounter for antineoplastic radiation therapy: Secondary | ICD-10-CM | POA: Diagnosis not present

## 2022-09-26 LAB — RAD ONC ARIA SESSION SUMMARY
Course Elapsed Days: 7
Plan Fractions Treated to Date: 5
Plan Prescribed Dose Per Fraction: 2.5 Gy
Plan Total Fractions Prescribed: 28
Plan Total Prescribed Dose: 70 Gy
Reference Point Dosage Given to Date: 12.5 Gy
Reference Point Session Dosage Given: 2.5 Gy
Session Number: 5

## 2022-09-27 ENCOUNTER — Other Ambulatory Visit: Payer: Self-pay

## 2022-09-27 ENCOUNTER — Ambulatory Visit
Admission: RE | Admit: 2022-09-27 | Discharge: 2022-09-27 | Disposition: A | Discharge status: Home / Self Care | Payer: Medicare HMO | Source: Ambulatory Visit | Attending: Radiation Oncology | Admitting: Radiation Oncology

## 2022-09-27 DIAGNOSIS — Z510 Encounter for antineoplastic radiation therapy: Secondary | ICD-10-CM | POA: Diagnosis not present

## 2022-09-27 LAB — RAD ONC ARIA SESSION SUMMARY
Course Elapsed Days: 8
Plan Fractions Treated to Date: 6
Plan Prescribed Dose Per Fraction: 2.5 Gy
Plan Total Fractions Prescribed: 28
Plan Total Prescribed Dose: 70 Gy
Reference Point Dosage Given to Date: 15 Gy
Reference Point Session Dosage Given: 2.5 Gy
Session Number: 6

## 2022-09-28 ENCOUNTER — Other Ambulatory Visit: Payer: Self-pay

## 2022-09-28 ENCOUNTER — Ambulatory Visit
Admission: RE | Admit: 2022-09-28 | Discharge: 2022-09-28 | Disposition: A | Discharge status: Home / Self Care | Payer: Medicare HMO | Source: Ambulatory Visit | Attending: Radiation Oncology | Admitting: Radiation Oncology

## 2022-09-28 DIAGNOSIS — Z510 Encounter for antineoplastic radiation therapy: Secondary | ICD-10-CM | POA: Diagnosis not present

## 2022-09-28 LAB — RAD ONC ARIA SESSION SUMMARY
Course Elapsed Days: 9
Plan Fractions Treated to Date: 7
Plan Prescribed Dose Per Fraction: 2.5 Gy
Plan Total Fractions Prescribed: 28
Plan Total Prescribed Dose: 70 Gy
Reference Point Dosage Given to Date: 17.5 Gy
Reference Point Session Dosage Given: 2.5 Gy
Session Number: 7

## 2022-10-02 ENCOUNTER — Other Ambulatory Visit: Payer: Self-pay

## 2022-10-02 ENCOUNTER — Ambulatory Visit
Admission: RE | Admit: 2022-10-02 | Discharge: 2022-10-02 | Disposition: A | Discharge status: Home / Self Care | Payer: Medicare HMO | Source: Ambulatory Visit | Attending: Radiation Oncology | Admitting: Radiation Oncology

## 2022-10-02 DIAGNOSIS — Z510 Encounter for antineoplastic radiation therapy: Secondary | ICD-10-CM | POA: Diagnosis not present

## 2022-10-02 LAB — RAD ONC ARIA SESSION SUMMARY
Course Elapsed Days: 13
Plan Fractions Treated to Date: 8
Plan Prescribed Dose Per Fraction: 2.5 Gy
Plan Total Fractions Prescribed: 28
Plan Total Prescribed Dose: 70 Gy
Reference Point Dosage Given to Date: 20 Gy
Reference Point Session Dosage Given: 2.5 Gy
Session Number: 8

## 2022-10-03 ENCOUNTER — Ambulatory Visit
Admission: RE | Admit: 2022-10-03 | Discharge: 2022-10-03 | Disposition: A | Discharge status: Home / Self Care | Payer: Medicare HMO | Source: Ambulatory Visit | Attending: Radiation Oncology | Admitting: Radiation Oncology

## 2022-10-03 ENCOUNTER — Other Ambulatory Visit: Payer: Self-pay

## 2022-10-03 DIAGNOSIS — Z510 Encounter for antineoplastic radiation therapy: Secondary | ICD-10-CM | POA: Diagnosis not present

## 2022-10-03 LAB — RAD ONC ARIA SESSION SUMMARY
Course Elapsed Days: 14
Plan Fractions Treated to Date: 9
Plan Prescribed Dose Per Fraction: 2.5 Gy
Plan Total Fractions Prescribed: 28
Plan Total Prescribed Dose: 70 Gy
Reference Point Dosage Given to Date: 22.5 Gy
Reference Point Session Dosage Given: 2.5 Gy
Session Number: 9

## 2022-10-04 ENCOUNTER — Ambulatory Visit
Admission: RE | Admit: 2022-10-04 | Discharge: 2022-10-04 | Disposition: A | Discharge status: Home / Self Care | Payer: Medicare HMO | Source: Ambulatory Visit | Attending: Radiation Oncology | Admitting: Radiation Oncology

## 2022-10-04 ENCOUNTER — Other Ambulatory Visit: Payer: Self-pay

## 2022-10-04 DIAGNOSIS — Z510 Encounter for antineoplastic radiation therapy: Secondary | ICD-10-CM | POA: Diagnosis not present

## 2022-10-04 LAB — RAD ONC ARIA SESSION SUMMARY
Course Elapsed Days: 15
Plan Fractions Treated to Date: 10
Plan Prescribed Dose Per Fraction: 2.5 Gy
Plan Total Fractions Prescribed: 28
Plan Total Prescribed Dose: 70 Gy
Reference Point Dosage Given to Date: 25 Gy
Reference Point Session Dosage Given: 2.5 Gy
Session Number: 10

## 2022-10-05 ENCOUNTER — Other Ambulatory Visit: Payer: Self-pay

## 2022-10-05 ENCOUNTER — Ambulatory Visit
Admission: RE | Admit: 2022-10-05 | Discharge: 2022-10-05 | Disposition: A | Discharge status: Home / Self Care | Payer: Medicare HMO | Source: Ambulatory Visit | Attending: Radiation Oncology | Admitting: Radiation Oncology

## 2022-10-05 DIAGNOSIS — Z510 Encounter for antineoplastic radiation therapy: Secondary | ICD-10-CM | POA: Diagnosis not present

## 2022-10-05 LAB — RAD ONC ARIA SESSION SUMMARY
Course Elapsed Days: 16
Plan Fractions Treated to Date: 11
Plan Prescribed Dose Per Fraction: 2.5 Gy
Plan Total Fractions Prescribed: 28
Plan Total Prescribed Dose: 70 Gy
Reference Point Dosage Given to Date: 27.5 Gy
Reference Point Session Dosage Given: 2.5 Gy
Session Number: 11

## 2022-10-08 ENCOUNTER — Ambulatory Visit
Admission: RE | Admit: 2022-10-08 | Discharge: 2022-10-08 | Disposition: A | Discharge status: Home / Self Care | Payer: Medicare HMO | Source: Ambulatory Visit | Attending: Radiation Oncology | Admitting: Radiation Oncology

## 2022-10-08 ENCOUNTER — Other Ambulatory Visit: Payer: Self-pay

## 2022-10-08 DIAGNOSIS — C61 Malignant neoplasm of prostate: Secondary | ICD-10-CM | POA: Diagnosis present

## 2022-10-08 DIAGNOSIS — Z510 Encounter for antineoplastic radiation therapy: Secondary | ICD-10-CM | POA: Insufficient documentation

## 2022-10-08 LAB — RAD ONC ARIA SESSION SUMMARY
Course Elapsed Days: 19
Plan Fractions Treated to Date: 12
Plan Prescribed Dose Per Fraction: 2.5 Gy
Plan Total Fractions Prescribed: 28
Plan Total Prescribed Dose: 70 Gy
Reference Point Dosage Given to Date: 30 Gy
Reference Point Session Dosage Given: 2.5 Gy
Session Number: 12

## 2022-10-09 ENCOUNTER — Inpatient Hospital Stay: Payer: Medicare HMO

## 2022-10-09 ENCOUNTER — Other Ambulatory Visit: Payer: Self-pay

## 2022-10-09 ENCOUNTER — Encounter: Payer: Self-pay | Admitting: Genetic Counselor

## 2022-10-09 ENCOUNTER — Inpatient Hospital Stay: Payer: Medicare HMO | Attending: Radiation Oncology | Admitting: Genetic Counselor

## 2022-10-09 ENCOUNTER — Ambulatory Visit
Admission: RE | Admit: 2022-10-09 | Discharge: 2022-10-09 | Disposition: A | Discharge status: Home / Self Care | Payer: Medicare HMO | Source: Ambulatory Visit | Attending: Radiation Oncology | Admitting: Radiation Oncology

## 2022-10-09 ENCOUNTER — Other Ambulatory Visit: Payer: Self-pay | Admitting: Genetic Counselor

## 2022-10-09 DIAGNOSIS — Z510 Encounter for antineoplastic radiation therapy: Secondary | ICD-10-CM | POA: Diagnosis not present

## 2022-10-09 DIAGNOSIS — Z806 Family history of leukemia: Secondary | ICD-10-CM | POA: Diagnosis not present

## 2022-10-09 DIAGNOSIS — Z808 Family history of malignant neoplasm of other organs or systems: Secondary | ICD-10-CM | POA: Diagnosis not present

## 2022-10-09 DIAGNOSIS — Z1379 Encounter for other screening for genetic and chromosomal anomalies: Secondary | ICD-10-CM

## 2022-10-09 DIAGNOSIS — Z8042 Family history of malignant neoplasm of prostate: Secondary | ICD-10-CM | POA: Diagnosis not present

## 2022-10-09 DIAGNOSIS — C61 Malignant neoplasm of prostate: Secondary | ICD-10-CM

## 2022-10-09 LAB — RAD ONC ARIA SESSION SUMMARY
Course Elapsed Days: 20
Plan Fractions Treated to Date: 13
Plan Prescribed Dose Per Fraction: 2.5 Gy
Plan Total Fractions Prescribed: 28
Plan Total Prescribed Dose: 70 Gy
Reference Point Dosage Given to Date: 32.5 Gy
Reference Point Session Dosage Given: 2.5 Gy
Session Number: 13

## 2022-10-09 LAB — GENETIC SCREENING ORDER

## 2022-10-09 NOTE — Progress Notes (Signed)
REFERRING PROVIDER: Margaretmary Dys, MD  PRIMARY PROVIDER:  Teena Irani, PA-C  PRIMARY REASON FOR VISIT:  1. Malignant neoplasm of prostate (HCC)   2. Family history of prostate cancer     HISTORY OF PRESENT ILLNESS:   Mr. Rehrer, a 77 y.o. male, was seen for a [facility] cancer genetics consultation at the request of Dr. Kathrynn Running due to a personal and family history of cancer.  Mr. Bamba presents to clinic today to discuss the possibility of a hereditary predisposition to cancer, to discuss genetic testing, and to further clarify his future cancer risks, as well as potential cancer risks for family members.   Mr. Loo was diagnosed with prostate cancer at age 29, Gleason 7.  CANCER HISTORY:  Oncology History  Malignant neoplasm of prostate (HCC)  08/01/2022 Cancer Staging   Staging form: Prostate, AJCC 8th Edition - Clinical stage from 08/01/2022: Stage IIB (cT1c, cN0, cM0, PSA: 4.2, Grade Group: 2) - Signed by Marcello Fennel, PA-C on 08/20/2022 Histopathologic type: Adenocarcinoma, NOS Stage prefix: Initial diagnosis Prostate specific antigen (PSA) range: Less than 10 Gleason primary pattern: 3 Gleason secondary pattern: 4 Gleason score: 7 Histologic grading system: 5 grade system Number of biopsy cores examined: 12 Number of biopsy cores positive: 6 Location of positive needle core biopsies: Both sides   08/20/2022 Initial Diagnosis   Malignant neoplasm of prostate     Past Medical History:  Diagnosis Date   AAA (abdominal aortic aneurysm) (HCC)    followed by cardiology;  last CT in epic 06-05-20203  44mm   Benign familial tremor    BPH associated with nocturia    Coronary artery disease    cardiologist--- dr Lovina Reach;   cardiac CT 10-30-2018  mild nonob cad, calcium score= 18.9   Diverticulosis of colon    Hiatal hernia    History of adenomatous polyp of colon    History of low-risk melanoma    History of squamous cell carcinoma excision    Hyperlipidemia,  mixed    Hypertension    Malignant neoplasm prostate (HCC) 07/2022   urologist--- dr borden/  radiation onologist-- dr Kathrynn Running;   dx 03/ 2024, Gleason 3+4   Pneumonia    05/2022, treated at home   Psoriasis     Past Surgical History:  Procedure Laterality Date   ACHILLES TENDON REPAIR Left 1995   APPENDECTOMY  1967   CATARACT EXTRACTION W/ INTRAOCULAR LENS IMPLANT Bilateral 2020   COLONOSCOPY WITH ESOPHAGOGASTRODUODENOSCOPY (EGD)  2021   GOLD SEED IMPLANT N/A 09/03/2022   Procedure: GOLD SEED IMPLANT;  Surgeon: Heloise Purpura, MD;  Location[facility];  Service: Urology;  Laterality: N/A;   INGUINAL HERNIA REPAIR     1980s  per pt unsure if left and right or one side done twice   LAPAROSCOPIC ASSISTED VENTRAL HERNIA REPAIR  05/15/2002   @[facility]   by dr Derrell Lolling   SPACE OAR INSTILLATION N/A 09/03/2022   Procedure: SPACE OAR INSTILLATION;  Surgeon: Heloise Purpura, MD;  Location: [facility];  Service: Urology;  Laterality: N/A;   UMBILICAL HERNIA REPAIR     1980s    Social History   Socioeconomic History   Marital status: Widowed    Spouse name: Not on file   Number of children: Not on file   Years of education: Not on file   Highest education level: Not on file  Occupational History   Not on file  Tobacco Use   Smoking status: Former  Types: Cigars   Smokeless tobacco: Never  Vaping Use   Vaping Use: Never used  Substance and Sexual Activity   Alcohol use: Yes    Alcohol/week: 8.0 standard drinks of alcohol    Types: 2 Glasses of wine, 6 Cans of beer per week   Drug use: Never   Sexual activity: Not on file    Comment: vasectomy (urology office )  Other Topics Concern   Not on file  Social History Narrative   Not on file   Social Determinants of Health   Financial Resource Strain: Not on file  Food Insecurity: No Food Insecurity (08/21/2022)   Hunger Vital Sign    Worried About Running Out of Food in the Last Year: Never true    Ran  Out of Food in the Last Year: Never true  Transportation Needs: No Transportation Needs (08/21/2022)   PRAPARE - Administrator, Civil Service (Medical): No    Lack of Transportation (Non-Medical): No  Physical Activity: Not on file  Stress: Not on file  Social Connections: Not on file     FAMILY HISTORY:  We obtained a detailed, 4-generation family history.  Significant diagnoses are listed below: Family History  Problem Relation Age of Onset   Marfan syndrome Mother    Coronary artery disease Father    Throat cancer Father 35       smoked   Prostate cancer Brother 77 - 11       radiation   Throat cancer Brother    Prostate cancer Brother 17 - 59       surgery, chemotherapy, radiation, Gleason 9   Marfan syndrome Brother    Prostate cancer Maternal Uncle    Prostate cancer Paternal Uncle 55 - 57   Leukemia Paternal Uncle    Prostate cancer Paternal Uncle 6 - 46     Mr. Guttery has five brothers. One brother was diagnosed with prostate cancer in his 30s, he had radiation as his treatment. A second brother was diagnosed with high risk prostate cancer in his 70s (Gleason 9), he had surgery, radiation, chemotherapy, and immunotherapy as his treatment. Mr. Wiedrich maternal uncle was diagnosed with prostate cancer at an unknown age, he died at age 37. Mr. Boghosian had two paternal uncles and both were diagnosed with prostate cancer in their late 50s/early 77s, they are deceased. His father was diagnosed with throat cancer at age 39, he smoked and died at age 56. Mr. Toledo believes his brother who had high risk prostate cancer may have pursued genetic testing, but he does not know his results. There is no reported Ashkenazi Jewish ancestry.  GENETIC COUNSELING ASSESSMENT: Mr. Counihan is a 77 y.o. male with a personal and family history of cancer which is somewhat suggestive of a hereditary predisposition to cancer. We, therefore, discussed and recommended the following at today's  visit.   DISCUSSION: We discussed that 5 - 10% of cancer is hereditary, with most cases of prostate cancer associated with BRCA1/2.  There are other genes that can be associated with hereditary breast cancer syndromes.  We discussed that testing is beneficial for several reasons including knowing how to follow individuals after completing their treatment, identifying whether potential treatment options would be beneficial, and understanding if other family members could be at risk for cancer and allowing them to undergo genetic testing.   We reviewed the characteristics, features and inheritance patterns of hereditary cancer syndromes. We also discussed genetic testing, including the appropriate family  members to test, the process of testing, insurance coverage and turn-around-time for results. We discussed the implications of a negative, positive, carrier and/or variant of uncertain significant result. We recommended Mr. Wiederholt pursue genetic testing for a panel that includes genes associated with prostate cancer.   Mr. Deeney was offered a common hereditary cancer panel (43 genes) and an expanded pan-cancer panel (70 genes). Mr. Bauerlein was informed of the benefits and limitations of each panel, including that expanded pan-cancer panels contain genes that do not have clear management guidelines at this point in time.  We also discussed that as the number of genes included on a panel increases, the chances of variants of uncertain significance increases. After considering the benefits and limitations of each gene panel, Mr. Kapala elected to have [facility] Custom Panel.  The Custom Hereditary Cancers Panel offered by [facility] includes sequencing and/or deletion duplication testing of the following 43 genes: APC, ATM, AXIN2, BAP1, BARD1, BMPR1A, BRCA1, BRCA2, BRIP1, CDH1, CDK4, CDKN2A (p14ARF and p16INK4a only), CHEK2, CTNNA1, EPCAM (Deletion/duplication testing only), FH, GREM1 (promoter region duplication  testing only), HOXB13, KIT, MBD4, MEN1, MLH1, MSH2, MSH3, MSH6, MUTYH, NF1, NHTL1, PALB2, PDGFRA, PMS2, POLD1, POLE, PTEN, RAD51C, RAD51D, SMAD4, SMARCA4. STK11, TP53, TSC1, TSC2, and VHL.  Based on Mr. Penninger personal and family history of cancer, he meets medical criteria for genetic testing. Despite that he meets criteria, he may still have an out of pocket cost. We discussed that if his out of pocket cost for testing is over $100, the laboratory will call and confirm whether he wants to proceed with testing.  If the out of pocket cost of testing is less than $100 he will be billed by the genetic testing laboratory.   PLAN: After considering the risks, benefits, and limitations, Mr. Elfman provided informed consent to pursue genetic testing and the blood sample was sent to [facility] for analysis of the Custom Panel. Results should be available within approximately 2-3 weeks' time, at which point they will be disclosed by telephone to Mr. Laton, as will any additional recommendations warranted by these results. Mr. Francisco will receive a summary of his genetic counseling visit and a copy of his results once available. This information will also be available in Epic.   Mr. Wolz questions were answered to his satisfaction today. Our contact information was provided should additional questions or concerns arise. Thank you for the referral and allowing [place] to share in the care of your patient.   Lalla Brothers, MS, [facility] Genetic Counselor [email or web address] (P) [phone]  The patient was seen for a total of 50 minutes in face-to-face genetic counseling. The patient was seen alone.  Drs. Pamelia Hoit and/or Mosetta Putt were available to discuss this case as needed.   _______________________________________________________________________ For Office Staff:  Number of people involved in session: 1 Was an Intern/ student involved with case: no

## 2022-10-10 ENCOUNTER — Other Ambulatory Visit: Payer: Self-pay

## 2022-10-10 ENCOUNTER — Ambulatory Visit
Admission: RE | Admit: 2022-10-10 | Discharge: 2022-10-10 | Disposition: A | Discharge status: Home / Self Care | Payer: Medicare HMO | Source: Ambulatory Visit | Attending: Radiation Oncology | Admitting: Radiation Oncology

## 2022-10-10 DIAGNOSIS — Z510 Encounter for antineoplastic radiation therapy: Secondary | ICD-10-CM | POA: Diagnosis not present

## 2022-10-10 LAB — RAD ONC ARIA SESSION SUMMARY
Course Elapsed Days: 21
Plan Fractions Treated to Date: 14
Plan Prescribed Dose Per Fraction: 2.5 Gy
Plan Total Fractions Prescribed: 28
Plan Total Prescribed Dose: 70 Gy
Reference Point Dosage Given to Date: 35 Gy
Reference Point Session Dosage Given: 2.5 Gy
Session Number: 14

## 2022-10-11 ENCOUNTER — Other Ambulatory Visit: Payer: Self-pay

## 2022-10-11 ENCOUNTER — Ambulatory Visit
Admission: RE | Admit: 2022-10-11 | Discharge: 2022-10-11 | Disposition: A | Discharge status: Home / Self Care | Payer: Medicare HMO | Source: Ambulatory Visit | Attending: Radiation Oncology | Admitting: Radiation Oncology

## 2022-10-11 DIAGNOSIS — Z510 Encounter for antineoplastic radiation therapy: Secondary | ICD-10-CM | POA: Diagnosis not present

## 2022-10-11 LAB — RAD ONC ARIA SESSION SUMMARY
Course Elapsed Days: 22
Plan Fractions Treated to Date: 15
Plan Prescribed Dose Per Fraction: 2.5 Gy
Plan Total Fractions Prescribed: 28
Plan Total Prescribed Dose: 70 Gy
Reference Point Dosage Given to Date: 37.5 Gy
Reference Point Session Dosage Given: 2.5 Gy
Session Number: 15

## 2022-10-12 ENCOUNTER — Other Ambulatory Visit: Payer: Self-pay

## 2022-10-12 ENCOUNTER — Ambulatory Visit
Admission: RE | Admit: 2022-10-12 | Discharge: 2022-10-12 | Disposition: A | Discharge status: Home / Self Care | Payer: Medicare HMO | Source: Ambulatory Visit | Attending: Radiation Oncology | Admitting: Radiation Oncology

## 2022-10-12 DIAGNOSIS — Z510 Encounter for antineoplastic radiation therapy: Secondary | ICD-10-CM | POA: Diagnosis not present

## 2022-10-12 LAB — RAD ONC ARIA SESSION SUMMARY
Course Elapsed Days: 23
Plan Fractions Treated to Date: 16
Plan Prescribed Dose Per Fraction: 2.5 Gy
Plan Total Fractions Prescribed: 28
Plan Total Prescribed Dose: 70 Gy
Reference Point Dosage Given to Date: 40 Gy
Reference Point Session Dosage Given: 2.5 Gy
Session Number: 16

## 2022-10-15 ENCOUNTER — Ambulatory Visit
Admission: RE | Admit: 2022-10-15 | Discharge: 2022-10-15 | Disposition: A | Discharge status: Home / Self Care | Payer: Medicare HMO | Source: Ambulatory Visit | Attending: Radiation Oncology | Admitting: Radiation Oncology

## 2022-10-15 ENCOUNTER — Other Ambulatory Visit: Payer: Self-pay

## 2022-10-15 DIAGNOSIS — Z510 Encounter for antineoplastic radiation therapy: Secondary | ICD-10-CM | POA: Diagnosis not present

## 2022-10-15 LAB — RAD ONC ARIA SESSION SUMMARY
Course Elapsed Days: 26
Plan Fractions Treated to Date: 17
Plan Prescribed Dose Per Fraction: 2.5 Gy
Plan Total Fractions Prescribed: 28
Plan Total Prescribed Dose: 70 Gy
Reference Point Dosage Given to Date: 42.5 Gy
Reference Point Session Dosage Given: 2.5 Gy
Session Number: 17

## 2022-10-16 ENCOUNTER — Other Ambulatory Visit: Payer: Self-pay

## 2022-10-16 ENCOUNTER — Ambulatory Visit
Admission: RE | Admit: 2022-10-16 | Discharge: 2022-10-16 | Disposition: A | Discharge status: Home / Self Care | Payer: Medicare HMO | Source: Ambulatory Visit | Attending: Radiation Oncology | Admitting: Radiation Oncology

## 2022-10-16 DIAGNOSIS — Z510 Encounter for antineoplastic radiation therapy: Secondary | ICD-10-CM | POA: Diagnosis not present

## 2022-10-16 LAB — RAD ONC ARIA SESSION SUMMARY
Course Elapsed Days: 27
Plan Fractions Treated to Date: 18
Plan Prescribed Dose Per Fraction: 2.5 Gy
Plan Total Fractions Prescribed: 28
Plan Total Prescribed Dose: 70 Gy
Reference Point Dosage Given to Date: 45 Gy
Reference Point Session Dosage Given: 2.5 Gy
Session Number: 18

## 2022-10-17 ENCOUNTER — Ambulatory Visit
Admission: RE | Admit: 2022-10-17 | Discharge: 2022-10-17 | Disposition: A | Discharge status: Home / Self Care | Payer: Medicare HMO | Source: Ambulatory Visit | Attending: Radiation Oncology | Admitting: Radiation Oncology

## 2022-10-17 ENCOUNTER — Other Ambulatory Visit: Payer: Self-pay

## 2022-10-17 ENCOUNTER — Ambulatory Visit: Admission: RE | Admit: 2022-10-17 | Payer: Medicare HMO | Source: Ambulatory Visit

## 2022-10-17 DIAGNOSIS — Z510 Encounter for antineoplastic radiation therapy: Secondary | ICD-10-CM | POA: Diagnosis not present

## 2022-10-17 LAB — RAD ONC ARIA SESSION SUMMARY
Course Elapsed Days: 28
Plan Fractions Treated to Date: 19
Plan Prescribed Dose Per Fraction: 2.5 Gy
Plan Total Fractions Prescribed: 28
Plan Total Prescribed Dose: 70 Gy
Reference Point Dosage Given to Date: 47.5 Gy
Reference Point Session Dosage Given: 2.5 Gy
Session Number: 19

## 2022-10-18 ENCOUNTER — Other Ambulatory Visit: Payer: Self-pay

## 2022-10-18 ENCOUNTER — Ambulatory Visit
Admission: RE | Admit: 2022-10-18 | Discharge: 2022-10-18 | Disposition: A | Discharge status: Home / Self Care | Payer: Medicare HMO | Source: Ambulatory Visit | Attending: Radiation Oncology | Admitting: Radiation Oncology

## 2022-10-18 DIAGNOSIS — Z510 Encounter for antineoplastic radiation therapy: Secondary | ICD-10-CM | POA: Diagnosis not present

## 2022-10-18 LAB — RAD ONC ARIA SESSION SUMMARY
Course Elapsed Days: 29
Plan Fractions Treated to Date: 20
Plan Prescribed Dose Per Fraction: 2.5 Gy
Plan Total Fractions Prescribed: 28
Plan Total Prescribed Dose: 70 Gy
Reference Point Dosage Given to Date: 50 Gy
Reference Point Session Dosage Given: 2.5 Gy
Session Number: 20

## 2022-10-19 ENCOUNTER — Telehealth: Payer: Self-pay | Admitting: Genetic Counselor

## 2022-10-19 ENCOUNTER — Other Ambulatory Visit: Payer: Self-pay

## 2022-10-19 ENCOUNTER — Ambulatory Visit
Admission: RE | Admit: 2022-10-19 | Discharge: 2022-10-19 | Disposition: A | Discharge status: Home / Self Care | Payer: Medicare HMO | Source: Ambulatory Visit | Attending: Radiation Oncology | Admitting: Radiation Oncology

## 2022-10-19 ENCOUNTER — Encounter: Payer: Self-pay | Admitting: Genetic Counselor

## 2022-10-19 DIAGNOSIS — Z510 Encounter for antineoplastic radiation therapy: Secondary | ICD-10-CM | POA: Diagnosis not present

## 2022-10-19 DIAGNOSIS — Z1379 Encounter for other screening for genetic and chromosomal anomalies: Secondary | ICD-10-CM | POA: Insufficient documentation

## 2022-10-19 LAB — RAD ONC ARIA SESSION SUMMARY
Course Elapsed Days: 30
Plan Fractions Treated to Date: 21
Plan Prescribed Dose Per Fraction: 2.5 Gy
Plan Total Fractions Prescribed: 28
Plan Total Prescribed Dose: 70 Gy
Reference Point Dosage Given to Date: 52.5 Gy
Reference Point Session Dosage Given: 2.5 Gy
Session Number: 21

## 2022-10-19 NOTE — Telephone Encounter (Signed)
I contacted Mr. Damewood to discuss his genetic testing results. No pathogenic variants were identified in the 43 genes analyzed. Detailed clinic note to follow.  The test report has been scanned into EPIC and is located under the Molecular Pathology section of the Results Review tab.  A portion of the result report is included below for reference.   Lalla Brothers, MS, [facility] Genetic Counselor [email or web address] (P) [phone]

## 2022-10-19 NOTE — Telephone Encounter (Signed)
I attempted to contact Mr. Sunga to discuss his genetic testing results (43 genes). I left a voicemail requesting he call me back at [phone].  Lalla Brothers, MS, [facility] Genetic Counselor [email or web address] (P) [phone]

## 2022-10-22 ENCOUNTER — Ambulatory Visit
Admission: RE | Admit: 2022-10-22 | Discharge: 2022-10-22 | Disposition: A | Discharge status: Home / Self Care | Payer: Medicare HMO | Source: Ambulatory Visit | Attending: Radiation Oncology | Admitting: Radiation Oncology

## 2022-10-22 ENCOUNTER — Other Ambulatory Visit: Payer: Self-pay

## 2022-10-22 DIAGNOSIS — Z510 Encounter for antineoplastic radiation therapy: Secondary | ICD-10-CM | POA: Diagnosis not present

## 2022-10-22 LAB — RAD ONC ARIA SESSION SUMMARY
Course Elapsed Days: 33
Plan Fractions Treated to Date: 22
Plan Prescribed Dose Per Fraction: 2.5 Gy
Plan Total Fractions Prescribed: 28
Plan Total Prescribed Dose: 70 Gy
Reference Point Dosage Given to Date: 55 Gy
Reference Point Session Dosage Given: 2.5 Gy
Session Number: 22

## 2022-10-23 ENCOUNTER — Other Ambulatory Visit: Payer: Self-pay

## 2022-10-23 ENCOUNTER — Ambulatory Visit
Admission: RE | Admit: 2022-10-23 | Discharge: 2022-10-23 | Disposition: A | Discharge status: Home / Self Care | Payer: Medicare HMO | Source: Ambulatory Visit | Attending: Radiation Oncology | Admitting: Radiation Oncology

## 2022-10-23 ENCOUNTER — Encounter: Payer: Self-pay | Admitting: Genetic Counselor

## 2022-10-23 DIAGNOSIS — Z510 Encounter for antineoplastic radiation therapy: Secondary | ICD-10-CM | POA: Diagnosis not present

## 2022-10-23 LAB — RAD ONC ARIA SESSION SUMMARY
Course Elapsed Days: 34
Plan Fractions Treated to Date: 23
Plan Prescribed Dose Per Fraction: 2.5 Gy
Plan Total Fractions Prescribed: 28
Plan Total Prescribed Dose: 70 Gy
Reference Point Dosage Given to Date: 57.5 Gy
Reference Point Session Dosage Given: 2.5 Gy
Session Number: 23

## 2022-10-24 ENCOUNTER — Other Ambulatory Visit: Payer: Self-pay

## 2022-10-24 ENCOUNTER — Ambulatory Visit
Admission: RE | Admit: 2022-10-24 | Discharge: 2022-10-24 | Disposition: A | Discharge status: Home / Self Care | Payer: Medicare HMO | Source: Ambulatory Visit | Attending: Radiation Oncology | Admitting: Radiation Oncology

## 2022-10-24 DIAGNOSIS — Z510 Encounter for antineoplastic radiation therapy: Secondary | ICD-10-CM | POA: Diagnosis not present

## 2022-10-24 LAB — RAD ONC ARIA SESSION SUMMARY
Course Elapsed Days: 35
Plan Fractions Treated to Date: 24
Plan Prescribed Dose Per Fraction: 2.5 Gy
Plan Total Fractions Prescribed: 28
Plan Total Prescribed Dose: 70 Gy
Reference Point Dosage Given to Date: 60 Gy
Reference Point Session Dosage Given: 2.5 Gy
Session Number: 24

## 2022-10-25 ENCOUNTER — Other Ambulatory Visit: Payer: Self-pay

## 2022-10-25 ENCOUNTER — Ambulatory Visit
Admission: RE | Admit: 2022-10-25 | Discharge: 2022-10-25 | Disposition: A | Discharge status: Home / Self Care | Payer: Medicare HMO | Source: Ambulatory Visit | Attending: Radiation Oncology | Admitting: Radiation Oncology

## 2022-10-25 DIAGNOSIS — Z510 Encounter for antineoplastic radiation therapy: Secondary | ICD-10-CM | POA: Diagnosis not present

## 2022-10-25 LAB — RAD ONC ARIA SESSION SUMMARY
Course Elapsed Days: 36
Plan Fractions Treated to Date: 25
Plan Prescribed Dose Per Fraction: 2.5 Gy
Plan Total Fractions Prescribed: 28
Plan Total Prescribed Dose: 70 Gy
Reference Point Dosage Given to Date: 62.5 Gy
Reference Point Session Dosage Given: 2.5 Gy
Session Number: 25

## 2022-10-26 ENCOUNTER — Other Ambulatory Visit: Payer: Self-pay

## 2022-10-26 ENCOUNTER — Ambulatory Visit
Admission: RE | Admit: 2022-10-26 | Discharge: 2022-10-26 | Disposition: A | Discharge status: Home / Self Care | Payer: Medicare HMO | Source: Ambulatory Visit | Attending: Radiation Oncology | Admitting: Radiation Oncology

## 2022-10-26 ENCOUNTER — Ambulatory Visit: Payer: Medicare HMO

## 2022-10-26 DIAGNOSIS — Z510 Encounter for antineoplastic radiation therapy: Secondary | ICD-10-CM | POA: Diagnosis not present

## 2022-10-26 LAB — RAD ONC ARIA SESSION SUMMARY
Course Elapsed Days: 37
Plan Fractions Treated to Date: 26
Plan Prescribed Dose Per Fraction: 2.5 Gy
Plan Total Fractions Prescribed: 28
Plan Total Prescribed Dose: 70 Gy
Reference Point Dosage Given to Date: 65 Gy
Reference Point Session Dosage Given: 2.5 Gy
Session Number: 26

## 2022-10-29 ENCOUNTER — Other Ambulatory Visit: Payer: Self-pay

## 2022-10-29 ENCOUNTER — Ambulatory Visit
Admission: RE | Admit: 2022-10-29 | Discharge: 2022-10-29 | Disposition: A | Discharge status: Home / Self Care | Payer: Medicare HMO | Source: Ambulatory Visit | Attending: Radiation Oncology | Admitting: Radiation Oncology

## 2022-10-29 ENCOUNTER — Ambulatory Visit: Payer: Medicare HMO

## 2022-10-29 DIAGNOSIS — Z510 Encounter for antineoplastic radiation therapy: Secondary | ICD-10-CM | POA: Diagnosis not present

## 2022-10-29 LAB — RAD ONC ARIA SESSION SUMMARY
Course Elapsed Days: 40
Plan Fractions Treated to Date: 27
Plan Prescribed Dose Per Fraction: 2.5 Gy
Plan Total Fractions Prescribed: 28
Plan Total Prescribed Dose: 70 Gy
Reference Point Dosage Given to Date: 67.5 Gy
Reference Point Session Dosage Given: 2.5 Gy
Session Number: 27

## 2022-10-30 ENCOUNTER — Ambulatory Visit: Payer: Medicare HMO

## 2022-10-30 ENCOUNTER — Other Ambulatory Visit: Payer: Self-pay

## 2022-10-30 ENCOUNTER — Ambulatory Visit
Admission: RE | Admit: 2022-10-30 | Discharge: 2022-10-30 | Disposition: A | Discharge status: Home / Self Care | Payer: Medicare HMO | Source: Ambulatory Visit | Attending: Radiation Oncology | Admitting: Radiation Oncology

## 2022-10-30 DIAGNOSIS — Z510 Encounter for antineoplastic radiation therapy: Secondary | ICD-10-CM | POA: Diagnosis not present

## 2022-10-30 LAB — RAD ONC ARIA SESSION SUMMARY
Course Elapsed Days: 41
Plan Fractions Treated to Date: 28
Plan Prescribed Dose Per Fraction: 2.5 Gy
Plan Total Fractions Prescribed: 28
Plan Total Prescribed Dose: 70 Gy
Reference Point Dosage Given to Date: 70 Gy
Reference Point Session Dosage Given: 2.5 Gy
Session Number: 28

## 2022-10-31 ENCOUNTER — Ambulatory Visit: Payer: Medicare HMO

## 2022-10-31 NOTE — Radiation Completion Notes (Addendum)
  Radiation Oncology         [phone] ________________________________  Name: [name] MRN: [id number]  Date: 10/30/2022  DOB: 09-Dec-1945  End of Treatment Note  Patient Name: Ruben Sosa, Ruben Sosa MRN: [id number] Date of Birth: 1946-01-21 Referring Physician: Crecencio Mc, M.D. Date of Service: 2022-10-31 Radiation Oncologist: Margaretmary Bayley, M.D. [facility] [place] [facility]     RADIATION ONCOLOGY END OF TREATMENT NOTE     Diagnosis: 77 y.o. gentleman with Stage T1c adenocarcinoma of the prostate with Gleason score of 3+4, and PSA of 4.2   Intent: Curative   ==========DELIVERED PLANS==========  First Treatment Date: 2022-09-19 - Last Treatment Date: 2022-10-30   Plan Name: Prostate Site: Prostate Technique: IMRT Mode: Photon Dose Per Fraction: 2.5 Gy Prescribed Dose (Delivered / Prescribed): 70 Gy / 70 Gy Prescribed Fxs (Delivered / Prescribed): 28 / 28     ==========ON TREATMENT VISIT DATES========== 2022-09-21, 2022-09-27, 2022-10-05, 2022-10-12, 2022-10-18, 2022-10-26     See weekly On Treatment Notes in Epic for details. The patient tolerated radiation treatment relatively well with only minor urinary symptoms of increased frequency and urgency as well as some modest fatigue. with only minor urinary symptoms of urgency/frequency as well as modest fatigue.  The patient will receive a call in about one month from the radiation oncology department. He will continue follow up with Dr. Laverle Patter as well.  ------------------------------------------------   Margaretmary Dys, MD [facility]  Radiation Oncology Direct Dial: [phone]  Fax: [phone] [email or web address]  Skype  LinkedIn

## 2022-11-05 ENCOUNTER — Ambulatory Visit: Payer: Self-pay | Admitting: Genetic Counselor

## 2022-11-05 DIAGNOSIS — Z1379 Encounter for other screening for genetic and chromosomal anomalies: Secondary | ICD-10-CM

## 2022-11-05 NOTE — Progress Notes (Signed)
HPI:   Ruben Sosa was previously seen in the [facility] due to a personal and family history of cancer and concerns regarding a hereditary predisposition to cancer. Please refer to our prior cancer genetics clinic note for more information regarding our discussion, assessment and recommendations, at the time. Ruben Sosa recent genetic test results were disclosed to him, as were recommendations warranted by these results. These results and recommendations are discussed in more detail below.  CANCER HISTORY:  Oncology History  Malignant neoplasm of prostate (HCC)  08/01/2022 Cancer Staging   Staging form: Prostate, AJCC 8th Edition - Clinical stage from 08/01/2022: Stage IIB (cT1c, cN0, cM0, PSA: 4.2, Grade Group: 2) - Signed by Marcello Fennel, PA-C on 08/20/2022 Histopathologic type: Adenocarcinoma, NOS Stage prefix: Initial diagnosis Prostate specific antigen (PSA) range: Less than 10 Gleason primary pattern: 3 Gleason secondary pattern: 4 Gleason score: 7 Histologic grading system: 5 grade system Number of biopsy cores examined: 12 Number of biopsy cores positive: 6 Location of positive needle core biopsies: Both sides   08/20/2022 Initial Diagnosis   Malignant neoplasm of prostate     FAMILY HISTORY:  We obtained a detailed, 4-generation family history.  Significant diagnoses are listed below:      Family History  Problem Relation Age of Onset   Marfan syndrome Mother     Coronary artery disease Father     Throat cancer Father 17        smoked   Prostate cancer Brother 79 - 19        radiation   Throat cancer Brother     Prostate cancer Brother 58 - 59        surgery, chemotherapy, radiation, Gleason 9   Marfan syndrome Brother     Prostate cancer Maternal Uncle     Prostate cancer Paternal Uncle 39 - 47   Leukemia Paternal Uncle     Prostate cancer Paternal Uncle 23 - 21       Ruben Sosa has five brothers. One brother was diagnosed with  prostate cancer in his 61s, he had radiation as his treatment. A second brother was diagnosed with high risk prostate cancer in his 79s (Gleason 9), he had surgery, radiation, chemotherapy, and immunotherapy as his treatment. Ruben Sosa maternal uncle was diagnosed with prostate cancer at an unknown age, he died at age 64. Ruben Sosa had two paternal uncles and both were diagnosed with prostate cancer in their late 50s/early 52s, they are deceased. His father was diagnosed with throat cancer at age 47, he smoked and died at age 37. Ruben Sosa believes his brother who had high risk prostate cancer may have pursued genetic testing, but he does not know his results. There is no reported Ashkenazi Jewish ancestry.  GENETIC TEST RESULTS:  The [facility] Custom Panel found no pathogenic mutations.   The Custom Hereditary Cancers Panel offered by [facility] includes sequencing and/or deletion duplication testing of the following 43 genes: APC, ATM, AXIN2, BAP1, BARD1, BMPR1A, BRCA1, BRCA2, BRIP1, CDH1, CDK4, CDKN2A (p14ARF and p16INK4a only), CHEK2, CTNNA1, EPCAM (Deletion/duplication testing only), FH, GREM1 (promoter region duplication testing only), HOXB13, KIT, MBD4, MEN1, MLH1, MSH2, MSH3, MSH6, MUTYH, NF1, NHTL1, PALB2, PDGFRA, PMS2, POLD1, POLE, PTEN, RAD51C, RAD51D, SMAD4, SMARCA4. STK11, TP53, TSC1, TSC2, and VHL.  The test report has been scanned into EPIC and is located under the Molecular Pathology section of the Results Review tab.  A portion of the result report is included below for reference. Genetic  testing reported out on 10/16/2022.     Even though a pathogenic variant was not identified, possible explanations for the cancer in the family may include: There may be no hereditary risk for cancer in the family. The cancers in Ruben Sosa and/or his family may be due to other genetic or environmental factors. There may be a gene mutation in one of these genes that current testing methods cannot  detect, but that chance is small. There could be another gene that has not yet been discovered, or that we have not yet tested, that is responsible for the cancer diagnoses in the family.  It is also possible there is a hereditary cause for the cancer in the family that Ruben Sosa did not inherit.  Therefore, it is important to remain in touch with cancer genetics in the future so that we can continue to offer Ruben Sosa the most up to date genetic testing.   ADDITIONAL GENETIC TESTING:  We discussed with Ruben Sosa that his genetic testing was fairly extensive.  If there are genes identified to increase cancer risk that can be analyzed in the future, we would be happy to discuss and coordinate this testing at that time.    CANCER SCREENING RECOMMENDATIONS:  Ruben Sosa test result is considered negative (normal).  This means that we have not identified a hereditary cause for his personal and family history of cancer at this time.   An individual's cancer risk and medical management are not determined by genetic test results alone. Overall cancer risk assessment incorporates additional factors, including personal medical history, family history, and any available genetic information that may result in a personalized plan for cancer prevention and surveillance. Therefore, it is recommended he continue to follow the cancer management and screening guidelines provided by his oncology and primary healthcare provider.  RECOMMENDATIONS FOR FAMILY MEMBERS:   Since he did not inherit a mutation in a cancer predisposition gene included on this panel, his children could not have inherited a mutation from him in one of these genes. Other members of the family may still carry a pathogenic variant in one of these genes that Ruben Sosa did not inherit. Based on the family history, we recommend his brothers, who were diagnosed with prostate cancer, have genetic counseling and testing.   FOLLOW-UP:  Cancer  genetics is a rapidly advancing field and it is possible that new genetic tests will be appropriate for him and/or his family members in the future. We encouraged him to remain in contact with cancer genetics on an annual basis so we can update his personal and family histories and let him know of advances in cancer genetics that may benefit this family.   Our contact number was provided. Ruben Sosa questions were answered to his satisfaction, and he knows he is welcome to call us at anytime with additional questions or concerns.   Lalla Brothers, MS, [facility] Genetic Counselor [email or web address] (P) [phone]

## 2022-11-15 NOTE — Progress Notes (Signed)
Patient was a RadOnc Consult on 08/21/22 for his stage T1c adenocarcinoma of the prostate with Gleason score of 3+4, and PSA of 4.2. Patient proceed with treatment recommendations of 5.5 weeks of daily radiation and had his final radiation treatment on 10/30/22.   Patient is scheduled for a post treatment nurse call on 12/11/22.  RN spoke with patient and provided post treatment education regarding PSA monitoring.  Pt aware that I will coordinate for urology follow up, patient request if I can call him with this this information early August due to upcoming 3 week trip.    Pt is scheduled for post treatment PSA on 9/17 @ 8:45am, and then MD follow up on 9/24 @ 8:45am.  RN will contact patient with appointment information at later time.

## 2022-11-23 ENCOUNTER — Other Ambulatory Visit: Payer: Self-pay | Admitting: Urology

## 2022-11-23 DIAGNOSIS — C61 Malignant neoplasm of prostate: Secondary | ICD-10-CM

## 2022-12-11 ENCOUNTER — Ambulatory Visit
Admission: RE | Admit: 2022-12-11 | Discharge: 2022-12-11 | Disposition: A | Discharge status: Home / Self Care | Payer: Medicare HMO | Source: Ambulatory Visit | Attending: Radiation Oncology | Admitting: Radiation Oncology

## 2022-12-11 NOTE — Progress Notes (Signed)
  Radiation Oncology         [phone] ________________________________  Name: Ruben Sosa MRN: [id number]  Date of Service: 12/11/2022  DOB: [birth year]  Post Treatment Telephone Note  Diagnosis:  77 y.o. gentleman with Stage T1c adenocarcinoma of the prostate with Gleason score of 3+4, and PSA of 4.2   (as documented in provider EOT note)  Pre Treatment IPSS Score: 4 (as documented in the provider consult note)  The patient was not available for call today.   Patient has a scheduled follow up visit with his urologist, Dr. Laverle Patter, on 01/2023 for ongoing surveillance. He was counseled that PSA levels will be drawn in the urology office, and was reassured that additional time is expected to improve bowel and bladder symptoms. He was encouraged to call back with concerns or questions regarding radiation.    [name], LPN

## 2022-12-17 ENCOUNTER — Encounter: Payer: Self-pay | Admitting: *Deleted

## 2022-12-17 NOTE — Progress Notes (Signed)
RN left message with patient confirming appointment with Dr. Laverle Patter on 9/24.

## 2023-01-10 ENCOUNTER — Encounter: Payer: Self-pay | Admitting: *Deleted

## 2023-01-10 ENCOUNTER — Inpatient Hospital Stay: Payer: Medicare HMO | Attending: Radiation Oncology | Admitting: *Deleted

## 2023-01-10 DIAGNOSIS — C61 Malignant neoplasm of prostate: Secondary | ICD-10-CM

## 2023-01-10 NOTE — Progress Notes (Signed)
SCP reviewed and completed. Pt scheduled for PSA check at [facility] on Sept.24th.

## 2023-02-04 ENCOUNTER — Other Ambulatory Visit: Payer: Self-pay | Admitting: Internal Medicine

## 2023-03-01 ENCOUNTER — Other Ambulatory Visit: Payer: Self-pay | Admitting: Internal Medicine

## 2023-03-07 ENCOUNTER — Encounter: Payer: Self-pay | Admitting: Internal Medicine

## 2023-03-07 ENCOUNTER — Ambulatory Visit: Payer: Medicare HMO | Attending: Internal Medicine | Admitting: Internal Medicine

## 2023-03-07 VITALS — BP 118/80 | HR 58 | Ht 74.0 in | Wt 225.0 lb

## 2023-03-07 DIAGNOSIS — I2510 Atherosclerotic heart disease of native coronary artery without angina pectoris: Secondary | ICD-10-CM

## 2023-03-07 DIAGNOSIS — E782 Mixed hyperlipidemia: Secondary | ICD-10-CM

## 2023-03-07 DIAGNOSIS — Z79899 Other long term (current) drug therapy: Secondary | ICD-10-CM

## 2023-03-07 DIAGNOSIS — Z125 Encounter for screening for malignant neoplasm of prostate: Secondary | ICD-10-CM

## 2023-03-07 DIAGNOSIS — Z0001 Encounter for general adult medical examination with abnormal findings: Secondary | ICD-10-CM | POA: Diagnosis not present

## 2023-03-07 DIAGNOSIS — Z131 Encounter for screening for diabetes mellitus: Secondary | ICD-10-CM

## 2023-03-07 DIAGNOSIS — I10 Essential (primary) hypertension: Secondary | ICD-10-CM

## 2023-03-07 MED ORDER — BISOPROLOL-HYDROCHLOROTHIAZIDE 5-6.25 MG PO TABS: 1.0000 | ORAL_TABLET | Freq: Every day | ORAL | 3 refills | Status: DC

## 2023-03-07 MED ORDER — ATORVASTATIN CALCIUM 20 MG PO TABS: 20.0000 mg | ORAL_TABLET | Freq: Every day | ORAL | 3 refills | Status: DC

## 2023-03-07 NOTE — Patient Instructions (Signed)
Medication Instructions:   *If you need a refill on your cardiac medications before your next appointment, please call your pharmacy*   Lab Work: CMET, CBC, NMR, APO B, TSH, PSA, VIT D , HGBA1C  If you have labs (blood work) drawn today and your tests are completely normal, you will receive your results only by: MyChart Message (if you have MyChart) OR A paper copy in the mail If you have any lab test that is abnormal or we need to change your treatment, we will call you to review the results.   Testing/Procedures:    Follow-Up: At [facility], you and your health needs are our priority.  As part of our continuing mission to provide you with exceptional heart care, we have created designated Provider Care Teams.  These Care Teams include your primary Cardiologist (physician) and Advanced Practice Providers (APPs -  Physician Assistants and Nurse Practitioners) who all work together to provide you with the care you need, when you need it.  We recommend signing up for the patient portal called "MyChart".  Sign up information is provided on this After Visit Summary.  MyChart is used to connect with patients for Virtual Visits (Telemedicine).  Patients are able to view lab/test results, encounter notes, upcoming appointments, etc.  Non-urgent messages can be sent to your provider as well.   To learn more about what you can do with MyChart, go to [email or web address].    Your next appointment:   1 year(s)  Provider:   Dietrich Pates, MD     Other Instructions

## 2023-03-07 NOTE — Progress Notes (Signed)
Cardiology Office Note   Date:  03/07/2023   ID:  Ruben Sosa, DOB 1945-05-12, MRN [id number]  PCP:  Dani Gobble, PA-C  Cardiologist:   Dietrich Pates, MD   F/U of HTN   History of Present Illness: Ruben Sosa is a 77 y.o. male with a history of HTN, HL and tremor.  He also has a hx of chest pain in the past   Une 2020 echo was  normal   June 2020   CT coronary angiogram showed calcium score was 18.9   There was mild nonobstructive CAD    Thoracic aorta 42 mm     Repeat CT in 2023 ascending aorta 44 mm   This was in normal range for BSA ( 1.9 cm/BSA) Since seen in clinic he was treated for prostate CA    He denies CP  No SOB  Admits in needing to walk more   No outpatient medications have been marked as taking for the 03/07/23 encounter (Office Visit) with Pricilla Riffle, MD.     Allergies:   Patient has no known allergies.   Past Medical History:  Diagnosis Date   AAA (abdominal aortic aneurysm) (HCC)    followed by cardiology;  last CT in epic 06-05-20203  44mm   Benign familial tremor    BPH associated with nocturia    Coronary artery disease    cardiologist--- dr Lovina Reach;   cardiac CT 10-30-2018  mild nonob cad, calcium score= 18.9   Diverticulosis of colon    Hiatal hernia    History of adenomatous polyp of colon    History of low-risk melanoma    History of squamous cell carcinoma excision    Hyperlipidemia, mixed    Hypertension    Malignant neoplasm prostate (HCC) 07/2022   urologist--- dr borden/  radiation onologist-- dr Kathrynn Running;   dx 03/ 2024, Gleason 3+4   Pneumonia    05/2022, treated at home   Psoriasis     Past Surgical History:  Procedure Laterality Date   ACHILLES TENDON REPAIR Left 1995   APPENDECTOMY  1967   CATARACT EXTRACTION W/ INTRAOCULAR LENS IMPLANT Bilateral 2020   COLONOSCOPY WITH ESOPHAGOGASTRODUODENOSCOPY (EGD)  2021   GOLD SEED IMPLANT N/A 09/03/2022   Procedure: GOLD SEED IMPLANT;  Surgeon: Heloise Purpura, MD;  Location:  [facility];  Service: Urology;  Laterality: N/A;   INGUINAL HERNIA REPAIR     1980s  per pt unsure if left and right or one side done twice   LAPAROSCOPIC ASSISTED VENTRAL HERNIA REPAIR  05/15/2002   @[facility]   by dr Derrell Lolling   SPACE OAR INSTILLATION N/A 09/03/2022   Procedure: SPACE OAR INSTILLATION;  Surgeon: Heloise Purpura, MD;  Location: [facility];  Service: Urology;  Laterality: N/A;   UMBILICAL HERNIA REPAIR     1980s     Social History:  The patient  reports that he has quit smoking. His smoking use included cigars. He has never used smokeless tobacco. He reports current alcohol use of about 8.0 standard drinks of alcohol per week. He reports that he does not use drugs.   Family History:  The patient's family history includes Coronary artery disease in his father; Leukemia in his paternal uncle; Marfan syndrome in his brother and mother; Prostate cancer in his maternal uncle; Prostate cancer (age of onset: 60 - 38) in his brother and brother; Prostate cancer (age of onset: 45 - 41) in his paternal uncle and  paternal uncle; Throat cancer in his brother; Throat cancer (age of onset: 59) in his father.    ROS:  Please see the history of present illness. All other systems are reviewed and  Negative to the above problem except as noted.    PHYSICAL EXAM: BP 118/80  P 58   Wt 225 lb GEN: Pt appears comfortable, in NAD   HEENT: normal  Neck: no JVD, no bruit Cardiac: RRR; no murmur   No LE edema  Respiratory:  clear to auscultation  GI: soft, nontender  No hepatomegaly   EKG:  EKG is ordered today.  Sinus bradycardia 58 bpm  Nonspecific ST changes    Lipid Panel    Component Value Date/Time   CHOL 148 01/24/2021 1037   CHOL 234 (H) 11/12/2014 0823   TRIG 134 01/24/2021 1037   TRIG 212 (H) 11/12/2014 0823   HDL 45 01/24/2021 1037   HDL 42 11/12/2014 0823   CHOLHDL 3.3 01/24/2021 1037   CHOLHDL 3.3 11/23/2015 0932   VLDL 38 (H) 11/23/2015 0932    LDLCALC 79 01/24/2021 1037   LDLCALC 150 (H) 11/12/2014 0823      Wt Readings from Last 3 Encounters:  03/07/23 225 lb (102.1 kg)  09/03/22 219 lb 14.4 oz (99.7 kg)  08/21/22 222 lb 4 oz (100.8 kg)      ASSESSMENT AND PLAN:  1  Hx CAD   Very mild CAD on CT scan   No symptoms of angina  Rx risk factorsrisks  2  Thoracic aorta   PT with recent CT showing ascending aorta measuring 44 mm ( up from 42 on last visit)  The pt is 6'2" and weighs 230   Overall places him in a low risk category   Probable follow up next yeare   3  HL  Check lipids today    4  HTN   BP is well controlled  5 Hgb A1C   Check A1C   6 PSA  Recheck PSA  Increase walking  F/U in 1 year     Current medicines are reviewed at length with the patient today.  The patient does not have concerns regarding medicines.  Signed, Dietrich Pates, MD  03/07/2023 10:15 AM    [facility] [address] [place], [place], [place]  [address] Phone: [phone]; Fax: [phone]

## 2023-03-08 LAB — NMR, LIPOPROFILE
Cholesterol, Total: 157 mg/dL (ref 100–199)
HDL Particle Number: 38.3 umol/L (ref >=30.5)
HDL-C: 53 mg/dL (ref >39)
LDL Particle Number: 1052 nmol/L — ABNORMAL HIGH (ref <1000)
LDL Size: 20.3 nm — ABNORMAL LOW (ref >20.5)
LDL-C (NIH Calc): 81 mg/dL (ref 0–99)
LP-IR Score: 61 — ABNORMAL HIGH (ref <=45)
Small LDL Particle Number: 596 nmol/L — ABNORMAL HIGH (ref <=527)
Triglycerides: 132 mg/dL (ref 0–149)

## 2023-03-08 LAB — COMPREHENSIVE METABOLIC PANEL
ALT: 39 [IU]/L (ref 0–44)
AST: 29 [IU]/L (ref 0–40)
Albumin: 4.4 g/dL (ref 3.8–4.8)
Alkaline Phosphatase: 76 [IU]/L (ref 44–121)
BUN/Creatinine Ratio: 15 (ref 10–24)
BUN: 10 mg/dL (ref 8–27)
Bilirubin Total: 0.8 mg/dL (ref 0.0–1.2)
CO2: 25 mmol/L (ref 20–29)
Calcium: 9.6 mg/dL (ref 8.6–10.2)
Chloride: 99 mmol/L (ref 96–106)
Creatinine, Ser: 0.66 mg/dL — ABNORMAL LOW (ref 0.76–1.27)
Globulin, Total: 2.4 g/dL (ref 1.5–4.5)
Glucose: 96 mg/dL (ref 70–99)
Potassium: 4.7 mmol/L (ref 3.5–5.2)
Sodium: 139 mmol/L (ref 134–144)
Total Protein: 6.8 g/dL (ref 6.0–8.5)
eGFR: 98 mL/min/{1.73_m2} (ref >59)

## 2023-03-08 LAB — CBC
Hematocrit: 44.1 % (ref 37.5–51.0)
Hemoglobin: 14.2 g/dL (ref 13.0–17.7)
MCH: 31.3 pg (ref 26.6–33.0)
MCHC: 32.2 g/dL (ref 31.5–35.7)
MCV: 97 fL (ref 79–97)
Platelets: 222 10*3/uL (ref 150–450)
RBC: 4.54 x10E6/uL (ref 4.14–5.80)
RDW: 12.6 % (ref 11.6–15.4)
WBC: 5 10*3/uL (ref 3.4–10.8)

## 2023-03-08 LAB — APOLIPOPROTEIN B: Apolipoprotein B: 73 mg/dL (ref <90)

## 2023-03-08 LAB — TSH: TSH: 2.38 u[IU]/mL (ref 0.450–4.500)

## 2023-03-08 LAB — HEMOGLOBIN A1C
Est. average glucose Bld gHb Est-mCnc: 117 mg/dL
Hgb A1c MFr Bld: 5.7 % — ABNORMAL HIGH (ref 4.8–5.6)

## 2023-03-08 LAB — VITAMIN D 25 HYDROXY (VIT D DEFICIENCY, FRACTURES): Vit D, 25-Hydroxy: 40.8 ng/mL (ref 30.0–100.0)

## 2023-03-08 LAB — PSA: Prostate Specific Ag, Serum: 1 ng/mL (ref 0.0–4.0)

## 2023-03-11 ENCOUNTER — Other Ambulatory Visit: Payer: Self-pay

## 2023-03-11 MED ORDER — VITAMIN D2 50 MCG (2000 UT) PO TABS: 2000.0000 [IU] | ORAL_TABLET | Freq: Every day | ORAL | Status: AC

## 2024-01-03 ENCOUNTER — Telehealth: Payer: Self-pay

## 2024-01-03 NOTE — Telephone Encounter (Signed)
   Name: Ruben Sosa  DOB: 1946-03-26  MRN: [id number]  Primary Cardiologist: Vina Gull, MD  Chart reviewed as part of pre-operative protocol coverage. Because of Ruben Sosa's past medical history and time since last visit, he will require a follow-up in-office visit in order to better assess preoperative cardiovascular risk.  Pre-op covering staff: - Please schedule appointment and call patient to inform them. If patient already had an upcoming appointment within acceptable timeframe, please add pre-op clearance to the appointment notes so provider is aware. - Please contact requesting surgeon's office via preferred method (i.e, phone, fax) to inform them of need for appointment prior to surgery.  Per office protocol, if patient is without any new symptoms or concerns at the time of their virtual visit, he may hold aspirin for 7 days prior to procedure. Please resume aspirin as soon as possible postprocedure, at the discretion of the surgeon.    Ruben Satterfield, NP  01/03/2024, 4:14 PM

## 2024-01-03 NOTE — Telephone Encounter (Signed)
 Called patient to schedule an appointment for preop clearance. Patient insisted that Dr. Okey and only Dr. Okey give him a call because she knows who he is. I offered him first available on 03/17/2024 and he refused to accept that date, said that he needs something sooner.

## 2024-01-03 NOTE — Telephone Encounter (Signed)
   Pre-operative Risk Assessment    Patient Name: Ruben Sosa  DOB: 1946/04/11 MRN: [id number]   Date of last office visit: 03/07/23 Ruben GULL, MD Date of next office visit: NONE   Request for Surgical Clearance    Procedure:  LEFT REVERSE TOTAL SHOULDER ARTHROPLASTY  Date of Surgery:  Clearance TBD                                Surgeon:  BONNER HAIR, MD Surgeon's Group or Practice Name:  [facility] Phone number:  [phone]  [phone] Fax number:  [phone]  ATTN: KELLY HIGH   Type of Clearance Requested:   - Medical  - Pharmacy:  Hold Aspirin     Type of Anesthesia:  General    Additional requests/questions:    SignedLucie DELENA Ku   01/03/2024, 4:06 PM

## 2024-01-08 NOTE — Telephone Encounter (Signed)
 Left message for patient to callback.  Offering appt with Dr. Okey at [place] office tomorrow 9/4 at 11:00 AM.

## 2024-01-08 NOTE — Telephone Encounter (Signed)
 Received callback from patient, he states he can make appt with Dr. Okey tomorrow at 11 AM in [place].  Clinical supervisor at [facility] clinic notified.

## 2024-01-09 ENCOUNTER — Ambulatory Visit: Attending: Internal Medicine | Admitting: Internal Medicine

## 2024-01-09 ENCOUNTER — Encounter: Payer: Self-pay | Admitting: Internal Medicine

## 2024-01-09 VITALS — BP 120/70 | HR 60 | Ht 74.0 in | Wt 226.6 lb

## 2024-01-09 DIAGNOSIS — I10 Essential (primary) hypertension: Secondary | ICD-10-CM

## 2024-01-09 NOTE — Progress Notes (Addendum)
 Cardiology Office Note   Date:  01/09/2024  ID:  Ruben Sosa, DOB 1945-08-23, MRN [id number]  PCP:  Jacques Camie Pepper, PA-C  Cardiologist:   Vina Gull, MD   F/U of HTN   History of Present Illness: Ruben Sosa is a 78 y.o. male with a history of HTN, HL and tremor.  He also has a hx of chest pain in the past   Une 2020 echo was  normal   June 2020   CT coronary angiogram showed calcium  score was 18.9   There was mild nonobstructive CAD    Thoracic aorta 42 mm     Repeat CT in 2023 ascending aorta 44 mm   This was in normal range for BSA ( 1.9 cm/BSA) Since seen in clinic he was treated for prostate CA    He denies CP  No SOB  Admits in needing to walk more   The pt has been doing well from a cardiac standpoint   Breathing is good  No CP  No dizziness    Remains active Admits that his diet could be better  Being evaluated for shoulder surgery   No outpatient medications have been marked as taking for the 01/09/24 encounter (Appointment) with Gull Vina GAILS, MD.     Allergies:   Patient has no known allergies.   Past Medical History:  Diagnosis Date   AAA (abdominal aortic aneurysm) (HCC)    followed by cardiology;  last CT in epic 06-05-20203  44mm   Benign familial tremor    BPH associated with nocturia    Coronary artery disease    cardiologist--- dr myrtis gull;   cardiac CT 10-30-2018  mild nonob cad, calcium  score= 18.9   Diverticulosis of colon    Hiatal hernia    History of adenomatous polyp of colon    History of low-risk melanoma    History of squamous cell carcinoma excision    Hyperlipidemia, mixed    Hypertension    Malignant neoplasm prostate (HCC) 07/2022   urologist--- dr borden/  radiation onologist-- dr patrcia;   dx 03/ 2024, Gleason 3+4   Pneumonia    05/2022, treated at home   Psoriasis     Past Surgical History:  Procedure Laterality Date   ACHILLES TENDON REPAIR Left 1995   APPENDECTOMY  1967   CATARACT EXTRACTION W/ INTRAOCULAR LENS  IMPLANT Bilateral 2020   COLONOSCOPY WITH ESOPHAGOGASTRODUODENOSCOPY (EGD)  2021   GOLD SEED IMPLANT N/A 09/03/2022   Procedure: GOLD SEED IMPLANT;  Surgeon: Renda Glance, MD;  Location: [facility];  Service: Urology;  Laterality: N/A;   INGUINAL HERNIA REPAIR     1980s  per pt unsure if left and right or one side done twice   LAPAROSCOPIC ASSISTED VENTRAL HERNIA REPAIR  05/15/2002   @[facility]   by dr gail   SPACE OAR INSTILLATION N/A 09/03/2022   Procedure: SPACE OAR INSTILLATION;  Surgeon: Renda Glance, MD;  Location: [facility];  Service: Urology;  Laterality: N/A;   UMBILICAL HERNIA REPAIR     1980s     Social History:  The patient  reports that he has quit smoking. His smoking use included cigars. He has never used smokeless tobacco. He reports current alcohol use of about 8.0 standard drinks of alcohol per week. He reports that he does not use drugs.   Family History:  The patient's family history includes Coronary artery disease in his father; Leukemia in his paternal uncle;  Marfan syndrome in his brother and mother; Prostate cancer in his maternal uncle; Prostate cancer (age of onset: 43 - 69) in his brother and brother; Prostate cancer (age of onset: 45 - 17) in his paternal uncle and paternal uncle; Throat cancer in his brother; Throat cancer (age of onset: 22) in his father.    ROS:  Please see the history of present illness. All other systems are reviewed and  Negative to the above problem except as noted.    PHYSICAL EXAM:  GEN: Pt appears comfortable, in NAD   HEENT: normal  Neck: no JVD, no bruits Cardiac: RRR; no murmurs   Respiratory:  clear to auscultation  GI: soft, nontender  No hepatomegaly  Ext  No LE edema   EKG:  EKG is ordered today.  Sinus bradycardia 59 bpm  Occasional PVC   Lipid Panel    Component Value Date/Time   CHOL 148 01/24/2021 1037   CHOL 234 (H) 11/12/2014 0823   TRIG 134 01/24/2021 1037   TRIG 212 (H)  11/12/2014 0823   HDL 45 01/24/2021 1037   HDL 42 11/12/2014 0823   CHOLHDL 3.3 01/24/2021 1037   CHOLHDL 3.3 11/23/2015 0932   VLDL 38 (H) 11/23/2015 0932   LDLCALC 79 01/24/2021 1037   LDLCALC 150 (H) 11/12/2014 0823      Wt Readings from Last 3 Encounters:  03/07/23 225 lb (102.1 kg)  09/03/22 219 lb 14.4 oz (99.7 kg)  08/21/22 222 lb 4 oz (100.8 kg)      ASSESSMENT AND PLAN:  1  Hx CAD   Very mild CAD on CT scan   Remains symptom free   Follow     2  Thoracic aorta  Ascending aorta 42 and 44 mm.  For BSA of 2.3 this is actually in normal range.    He thinks he has had a CT done  Will confirm with Camie Franchot Mirza, PA who follows patient in clinic   3  HL   Last LDL 70  HDL 50  Trig 146  TOtal 145   4  HTN   BP remains well controlled   5 Hgb A1C A1C 5.7   Reviewed diet   6  Preop risk stratification.   Pt active  No symptoms of angina   From cardiac standpoint I think he is at low risk for major cardiac event  OK to proceed without further testing     Will again contact S Spencer  Otherwise F/U in 1 year     Current medicines are reviewed at length with the patient today.  The patient does not have concerns regarding medicines.  Signed, Vina Gull, MD  [facility]

## 2024-01-09 NOTE — Patient Instructions (Signed)
 Medication Instructions:  Your physician recommends that you continue on your current medications as directed. Please refer to the Current Medication list given to you today.  *If you need a refill on your cardiac medications before your next appointment, please call your pharmacy*  Lab Work: NONE   If you have labs (blood work) drawn today and your tests are completely normal, you will receive your results only by: MyChart Message (if you have MyChart) OR A paper copy in the mail If you have any lab test that is abnormal or we need to change your treatment, we will call you to review the results.  Testing/Procedures: NONE   Follow-Up: At [facility], you and your health needs are our priority.  As part of our continuing mission to provide you with exceptional heart care, our providers are all part of one team.  This team includes your primary Cardiologist (physician) and Advanced Practice Providers or APPs (Physician Assistants and Nurse Practitioners) who all work together to provide you with the care you need, when you need it.  Your next appointment:   1 year(s)  Provider:   Vina Gull, MD    We recommend signing up for the patient portal called MyChart.  Sign up information is provided on this After Visit Summary.  MyChart is used to connect with patients for Virtual Visits (Telemedicine).  Patients are able to view lab/test results, encounter notes, upcoming appointments, etc.  Non-urgent messages can be sent to your provider as well.   To learn more about what you can do with MyChart, go to [email or web address].   Other Instructions Thank you for choosing  [facility]!

## 2024-01-15 ENCOUNTER — Telehealth: Payer: Self-pay | Admitting: Internal Medicine

## 2024-01-15 ENCOUNTER — Encounter: Payer: Self-pay | Admitting: Orthopaedic Surgery

## 2024-01-15 ENCOUNTER — Other Ambulatory Visit: Payer: Self-pay | Admitting: Orthopaedic Surgery

## 2024-01-15 DIAGNOSIS — G8929 Other chronic pain: Secondary | ICD-10-CM

## 2024-01-15 NOTE — Telephone Encounter (Signed)
 Ruben Mirza, PA called in about Dr. Nada OV note for pt 01/09/24. She asked if she could speak with her at her earliest convenience about getting pt CT ordered and f/u.

## 2024-01-17 ENCOUNTER — Ambulatory Visit
Admission: RE | Admit: 2024-01-17 | Discharge: 2024-01-17 | Disposition: A | Discharge status: Home / Self Care | Source: Ambulatory Visit | Attending: Orthopaedic Surgery | Admitting: Orthopaedic Surgery

## 2024-01-17 DIAGNOSIS — G8929 Other chronic pain: Secondary | ICD-10-CM

## 2024-01-23 NOTE — Progress Notes (Signed)
 Spoke with PCP.  Pt has not had CTA of aorta since 2023 WOuld recomm scheduling repeat CTA of aorta    Not emergent

## 2024-03-25 ENCOUNTER — Telehealth: Payer: Self-pay | Admitting: Internal Medicine

## 2024-03-25 MED ORDER — BISOPROLOL-HYDROCHLOROTHIAZIDE 5-6.25 MG PO TABS: 1.0000 | ORAL_TABLET | Freq: Every day | ORAL | 4 refills | Status: AC

## 2024-03-25 MED ORDER — ATORVASTATIN CALCIUM 20 MG PO TABS: 20.0000 mg | ORAL_TABLET | Freq: Every day | ORAL | 4 refills | Status: AC

## 2024-03-25 NOTE — Telephone Encounter (Signed)
 I put in the refills on Bisoprolol  hydrochlorothiazide  and Lipitor. Both sent to the [facility] that was on file and refilled for 12 months. I called the patient and let him know.

## 2024-03-25 NOTE — Telephone Encounter (Signed)
 PT needs refills on Bisoprolol  hydrochlorothiazide  and Lipitor Both Generic   [facility]      Fill refills for 12 months

## 2024-03-25 NOTE — Addendum Note (Signed)
 Addended by: KRASOWSKI, Essense Bousquet on: 03/25/2024 04:04 PM   Modules accepted: Orders
